# Patient Record
Sex: Male | Born: 1940
Health system: Southern US, Community
[De-identification: ages and names within clinical notes are randomized; demographics above are authoritative.]

## PROBLEM LIST (undated history)

## (undated) DIAGNOSIS — I10 Essential (primary) hypertension: Secondary | ICD-10-CM

## (undated) DIAGNOSIS — M109 Gout, unspecified: Secondary | ICD-10-CM

## (undated) DIAGNOSIS — M1990 Unspecified osteoarthritis, unspecified site: Secondary | ICD-10-CM

## (undated) DIAGNOSIS — Z9889 Other specified postprocedural states: Secondary | ICD-10-CM

## (undated) DIAGNOSIS — T8859XA Other complications of anesthesia, initial encounter: Secondary | ICD-10-CM

## (undated) DIAGNOSIS — R112 Nausea with vomiting, unspecified: Secondary | ICD-10-CM

## (undated) DIAGNOSIS — E039 Hypothyroidism, unspecified: Secondary | ICD-10-CM

## (undated) DIAGNOSIS — K219 Gastro-esophageal reflux disease without esophagitis: Secondary | ICD-10-CM

## (undated) HISTORY — PX: APPENDECTOMY: SHX54

## (undated) HISTORY — PX: JOINT REPLACEMENT: SHX530

## (undated) HISTORY — PX: TONSILLECTOMY: SUR1361

---

## 2017-04-09 DIAGNOSIS — M1000 Idiopathic gout, unspecified site: Secondary | ICD-10-CM | POA: Diagnosis not present

## 2017-04-09 DIAGNOSIS — I10 Essential (primary) hypertension: Secondary | ICD-10-CM | POA: Diagnosis not present

## 2017-04-09 DIAGNOSIS — M7750 Other enthesopathy of unspecified foot: Secondary | ICD-10-CM | POA: Diagnosis not present

## 2017-04-09 DIAGNOSIS — E7800 Pure hypercholesterolemia, unspecified: Secondary | ICD-10-CM | POA: Diagnosis not present

## 2017-04-09 DIAGNOSIS — Z23 Encounter for immunization: Secondary | ICD-10-CM | POA: Diagnosis not present

## 2017-04-09 DIAGNOSIS — Z125 Encounter for screening for malignant neoplasm of prostate: Secondary | ICD-10-CM | POA: Diagnosis not present

## 2017-10-07 DIAGNOSIS — E790 Hyperuricemia without signs of inflammatory arthritis and tophaceous disease: Secondary | ICD-10-CM | POA: Diagnosis not present

## 2017-10-07 DIAGNOSIS — I10 Essential (primary) hypertension: Secondary | ICD-10-CM | POA: Diagnosis not present

## 2017-10-07 DIAGNOSIS — E7800 Pure hypercholesterolemia, unspecified: Secondary | ICD-10-CM | POA: Diagnosis not present

## 2017-10-07 DIAGNOSIS — N401 Enlarged prostate with lower urinary tract symptoms: Secondary | ICD-10-CM | POA: Diagnosis not present

## 2017-10-07 DIAGNOSIS — R3911 Hesitancy of micturition: Secondary | ICD-10-CM | POA: Diagnosis not present

## 2018-04-12 DIAGNOSIS — N401 Enlarged prostate with lower urinary tract symptoms: Secondary | ICD-10-CM | POA: Diagnosis not present

## 2018-04-12 DIAGNOSIS — R3911 Hesitancy of micturition: Secondary | ICD-10-CM | POA: Diagnosis not present

## 2018-04-12 DIAGNOSIS — I10 Essential (primary) hypertension: Secondary | ICD-10-CM | POA: Diagnosis not present

## 2018-04-12 DIAGNOSIS — E7800 Pure hypercholesterolemia, unspecified: Secondary | ICD-10-CM | POA: Diagnosis not present

## 2018-04-12 DIAGNOSIS — L308 Other specified dermatitis: Secondary | ICD-10-CM | POA: Diagnosis not present

## 2018-04-12 DIAGNOSIS — Z0000 Encounter for general adult medical examination without abnormal findings: Secondary | ICD-10-CM | POA: Diagnosis not present

## 2018-04-12 DIAGNOSIS — E790 Hyperuricemia without signs of inflammatory arthritis and tophaceous disease: Secondary | ICD-10-CM | POA: Diagnosis not present

## 2018-10-11 DIAGNOSIS — I10 Essential (primary) hypertension: Secondary | ICD-10-CM | POA: Diagnosis not present

## 2018-10-11 DIAGNOSIS — R3911 Hesitancy of micturition: Secondary | ICD-10-CM | POA: Diagnosis not present

## 2018-10-11 DIAGNOSIS — N401 Enlarged prostate with lower urinary tract symptoms: Secondary | ICD-10-CM | POA: Diagnosis not present

## 2018-10-11 DIAGNOSIS — E7800 Pure hypercholesterolemia, unspecified: Secondary | ICD-10-CM | POA: Diagnosis not present

## 2018-10-11 DIAGNOSIS — T2122XA Burn of second degree of abdominal wall, initial encounter: Secondary | ICD-10-CM | POA: Diagnosis not present

## 2019-04-14 DIAGNOSIS — Z0000 Encounter for general adult medical examination without abnormal findings: Secondary | ICD-10-CM | POA: Diagnosis not present

## 2019-04-14 DIAGNOSIS — I10 Essential (primary) hypertension: Secondary | ICD-10-CM | POA: Diagnosis not present

## 2019-04-14 DIAGNOSIS — E7800 Pure hypercholesterolemia, unspecified: Secondary | ICD-10-CM | POA: Diagnosis not present

## 2019-04-14 DIAGNOSIS — E790 Hyperuricemia without signs of inflammatory arthritis and tophaceous disease: Secondary | ICD-10-CM | POA: Diagnosis not present

## 2019-04-14 DIAGNOSIS — N401 Enlarged prostate with lower urinary tract symptoms: Secondary | ICD-10-CM | POA: Diagnosis not present

## 2019-10-19 DIAGNOSIS — R3911 Hesitancy of micturition: Secondary | ICD-10-CM | POA: Diagnosis not present

## 2019-10-19 DIAGNOSIS — N401 Enlarged prostate with lower urinary tract symptoms: Secondary | ICD-10-CM | POA: Diagnosis not present

## 2019-10-19 DIAGNOSIS — E7800 Pure hypercholesterolemia, unspecified: Secondary | ICD-10-CM | POA: Diagnosis not present

## 2019-10-19 DIAGNOSIS — E790 Hyperuricemia without signs of inflammatory arthritis and tophaceous disease: Secondary | ICD-10-CM | POA: Diagnosis not present

## 2019-10-19 DIAGNOSIS — I10 Essential (primary) hypertension: Secondary | ICD-10-CM | POA: Diagnosis not present

## 2019-10-19 DIAGNOSIS — B49 Unspecified mycosis: Secondary | ICD-10-CM | POA: Diagnosis not present

## 2020-01-02 DIAGNOSIS — M25552 Pain in left hip: Secondary | ICD-10-CM | POA: Diagnosis not present

## 2020-01-04 DIAGNOSIS — Z01818 Encounter for other preprocedural examination: Secondary | ICD-10-CM | POA: Diagnosis not present

## 2020-01-04 DIAGNOSIS — E7800 Pure hypercholesterolemia, unspecified: Secondary | ICD-10-CM | POA: Diagnosis not present

## 2020-01-04 DIAGNOSIS — R001 Bradycardia, unspecified: Secondary | ICD-10-CM | POA: Diagnosis not present

## 2020-01-04 DIAGNOSIS — I10 Essential (primary) hypertension: Secondary | ICD-10-CM | POA: Diagnosis not present

## 2020-01-04 DIAGNOSIS — E031 Congenital hypothyroidism without goiter: Secondary | ICD-10-CM | POA: Diagnosis not present

## 2020-01-09 ENCOUNTER — Ambulatory Visit: Payer: Self-pay | Admitting: Student

## 2020-01-16 ENCOUNTER — Ambulatory Visit: Payer: Self-pay | Admitting: Student

## 2020-01-16 NOTE — H&P (View-Only) (Signed)
TOTAL HIP REVISION ADMISSION H&P  Patient is admitted for left revision total hip arthroplasty.  Subjective:  Chief Complaint: left hip pain  HPI: Jay Washington, 79 y.o. male, has a history of pain and functional disability in the left hip due to arthritis and patient has failed non-surgical conservative treatments for greater than 12 weeks to include NSAID's and/or analgesics and activity modification. The indications for the revision total hip arthroplasty are complete poly-wear.  Onset of symptoms was gradual starting 2 years ago with gradually worsening course since that time.  Prior procedures on the left hip include arthroplasty.  Patient currently rates pain in the left hip at 8 out of 10 with activity.  There is worsening of pain with activity and weight bearing, pain that interfers with activities of daily living and pain with passive range of motion. Patient has evidence of polywear by imaging studies.  This condition presents safety issues increasing the risk of falls.  There is no current active infection.  There are no problems to display for this patient.  Past Medical History:  Diagnosis Date  . Arthritis    hands,   . Complication of anesthesia   . GERD (gastroesophageal reflux disease)   . Gout   . Hypertension   . Hypothyroidism   . PONV (postoperative nausea and vomiting)     Past Surgical History:  Procedure Laterality Date  . APPENDECTOMY    . JOINT REPLACEMENT Left    hip  . TONSILLECTOMY      No current facility-administered medications for this visit.   No current outpatient medications on file.   Facility-Administered Medications Ordered in Other Visits  Medication Dose Route Frequency Provider Last Rate Last Admin  . 0.9 %  sodium chloride infusion   Intravenous Continuous Cherlynn June B, PA      . acetaminophen (OFIRMEV) IV 1,000 mg  1,000 mg Intravenous Once Daiana Vitiello B, [place]      . acetaminophen (TYLENOL) tablet 1,000 mg  1,000 mg Oral Once  Annye Asa, MD      . ceFAZolin (ANCEF) IVPB 2g/100 mL premix  2 g Intravenous On Call to OR Cherlynn June B, PA      . lactated ringers infusion   Intravenous Continuous Janeece Riggers, MD 10 mL/hr at 01/26/20 0910 New Bag at 01/26/20 0910  . povidone-iodine 10 % swab 2 application  2 application Topical Once Cherlynn June B, [place]      . tranexamic acid (CYKLOKAPRON) IVPB 1,000 mg  1,000 mg Intravenous To OR Cherlynn June B, PA       No Known Allergies  Social History   Tobacco Use  . Smoking status: Never Smoker  . Smokeless tobacco: Never Used  Substance Use Topics  . Alcohol use: Never    No family history on file.    Review of Systems  Musculoskeletal: Positive for arthralgias.  All other systems reviewed and are negative.   Objective:  Physical Exam HENT:     Head: Normocephalic.  Eyes:     Pupils: Pupils are equal, round, and reactive to light.  Cardiovascular:     Rate and Rhythm: Normal rate and regular rhythm.     Heart sounds: Normal heart sounds.  Pulmonary:     Breath sounds: Normal breath sounds.  Abdominal:     Palpations: Abdomen is soft.     Tenderness: There is no abdominal tenderness.  Genitourinary:    Comments: Deferred Musculoskeletal:     Cervical back: Normal  range of motion.     Comments: Examination left hips reveals no wounds, lesions, or bruising. There are well healed scars on the posterior and lateral portions of the left hip. He does not have trochanteric tenderness to palpation of the left hip. moderate pain with passive range of motion. Pain in the position of impingement.He is neurovascularly intact.  Skin:    General: Skin is warm and dry.  Neurological:     Mental Status: He is alert and oriented to person, place, and time.  Psychiatric:        Mood and Affect: Mood normal.     Vital signs in last 24 hours: @VSRANGES @   Labs:   Estimated body mass index is 31.01 kg/m as calculated from the following:   Height  as of 01/26/20: 5\' 9"  (1.753 m).   Weight as of 01/26/20: 95.3 kg.  Imaging Review:  Plain radiographs demonstrate severe degenerative joint disease of the left hip(s). The bone quality appears to be adequate for age and reported activity level. There is excessive polywear.    Assessment/Plan:  End stage arthritis, left hip(s) with failed previous arthroplasty.  The patient history, physical examination, clinical judgement of the provider and imaging studies are consistent with end stage degenerative joint disease of the left hip(s), previous total hip arthroplasty. Revision total hip arthroplasty is deemed medically necessary. The treatment options including medical management, injection therapy, arthroscopy and arthroplasty were discussed at length. The risks and benefits of total hip arthroplasty were presented and reviewed. The risks due to aseptic loosening, infection, stiffness, dislocation/subluxation,  thromboembolic complications and other imponderables were discussed.  The patient acknowledged the explanation, agreed to proceed with the plan and consent was signed. Patient is being admitted for inpatient treatment for surgery, pain control, PT, OT, prophylactic antibiotics, VTE prophylaxis, progressive ambulation and ADL's and discharge planning. The patient is planning to be discharged home.

## 2020-01-16 NOTE — H&P (Signed)
TOTAL HIP REVISION ADMISSION H&P  Patient is admitted for left revision total hip arthroplasty.  Subjective:  Chief Complaint: left hip pain  HPI: Jay Washington, 79 y.o. male, has a history of pain and functional disability in the left hip due to arthritis and patient has failed non-surgical conservative treatments for greater than 12 weeks to include NSAID's and/or analgesics and activity modification. The indications for the revision total hip arthroplasty are complete poly-wear.  Onset of symptoms was gradual starting 2 years ago with gradually worsening course since that time.  Prior procedures on the left hip include arthroplasty.  Patient currently rates pain in the left hip at 8 out of 10 with activity.  There is worsening of pain with activity and weight bearing, pain that interfers with activities of daily living and pain with passive range of motion. Patient has evidence of polywear by imaging studies.  This condition presents safety issues increasing the risk of falls.  There is no current active infection.  There are no problems to display for this patient.  Past Medical History:  Diagnosis Date  . Arthritis    hands,   . Complication of anesthesia   . GERD (gastroesophageal reflux disease)   . Gout   . Hypertension   . Hypothyroidism   . PONV (postoperative nausea and vomiting)     Past Surgical History:  Procedure Laterality Date  . APPENDECTOMY    . JOINT REPLACEMENT Left    hip  . TONSILLECTOMY      No current facility-administered medications for this visit.   No current outpatient medications on file.   Facility-Administered Medications Ordered in Other Visits  Medication Dose Route Frequency Provider Last Rate Last Admin  . 0.9 %  sodium chloride infusion   Intravenous Continuous Cherlynn June B, PA      . acetaminophen (OFIRMEV) IV 1,000 mg  1,000 mg Intravenous Once Luay Balding B, [place]      . acetaminophen (TYLENOL) tablet 1,000 mg  1,000 mg Oral Once  Annye Asa, MD      . ceFAZolin (ANCEF) IVPB 2g/100 mL premix  2 g Intravenous On Call to OR Cherlynn June B, PA      . lactated ringers infusion   Intravenous Continuous Janeece Riggers, MD 10 mL/hr at 01/26/20 0910 New Bag at 01/26/20 0910  . povidone-iodine 10 % swab 2 application  2 application Topical Once Cherlynn June B, [place]      . tranexamic acid (CYKLOKAPRON) IVPB 1,000 mg  1,000 mg Intravenous To OR Cherlynn June B, PA       No Known Allergies  Social History   Tobacco Use  . Smoking status: Never Smoker  . Smokeless tobacco: Never Used  Substance Use Topics  . Alcohol use: Never    No family history on file.    Review of Systems  Musculoskeletal: Positive for arthralgias.  All other systems reviewed and are negative.   Objective:  Physical Exam HENT:     Head: Normocephalic.  Eyes:     Pupils: Pupils are equal, round, and reactive to light.  Cardiovascular:     Rate and Rhythm: Normal rate and regular rhythm.     Heart sounds: Normal heart sounds.  Pulmonary:     Breath sounds: Normal breath sounds.  Abdominal:     Palpations: Abdomen is soft.     Tenderness: There is no abdominal tenderness.  Genitourinary:    Comments: Deferred Musculoskeletal:     Cervical back: Normal  range of motion.     Comments: Examination left hips reveals no wounds, lesions, or bruising. There are well healed scars on the posterior and lateral portions of the left hip. He does not have trochanteric tenderness to palpation of the left hip. moderate pain with passive range of motion. Pain in the position of impingement.He is neurovascularly intact.  Skin:    General: Skin is warm and dry.  Neurological:     Mental Status: He is alert and oriented to person, place, and time.  Psychiatric:        Mood and Affect: Mood normal.     Vital signs in last 24 hours: @VSRANGES @   Labs:   Estimated body mass index is 31.01 kg/m as calculated from the following:   Height  as of 01/26/20: 5\' 9"  (1.753 m).   Weight as of 01/26/20: 95.3 kg.  Imaging Review:  Plain radiographs demonstrate severe degenerative joint disease of the left hip(s). The bone quality appears to be adequate for age and reported activity level. There is excessive polywear.    Assessment/Plan:  End stage arthritis, left hip(s) with failed previous arthroplasty.  The patient history, physical examination, clinical judgement of the provider and imaging studies are consistent with end stage degenerative joint disease of the left hip(s), previous total hip arthroplasty. Revision total hip arthroplasty is deemed medically necessary. The treatment options including medical management, injection therapy, arthroscopy and arthroplasty were discussed at length. The risks and benefits of total hip arthroplasty were presented and reviewed. The risks due to aseptic loosening, infection, stiffness, dislocation/subluxation,  thromboembolic complications and other imponderables were discussed.  The patient acknowledged the explanation, agreed to proceed with the plan and consent was signed. Patient is being admitted for inpatient treatment for surgery, pain control, PT, OT, prophylactic antibiotics, VTE prophylaxis, progressive ambulation and ADL's and discharge planning. The patient is planning to be discharged home.

## 2020-01-18 ENCOUNTER — Encounter (HOSPITAL_COMMUNITY): Payer: Self-pay

## 2020-01-18 NOTE — Patient Instructions (Addendum)
DUE TO COVID-19 ONLY ONE VISITOR IS ALLOWED TO COME WITH YOU AND STAY IN THE WAITING ROOM ONLY DURING PRE OP AND PROCEDURE DAY OF SURGERY. THE 1 VISITOR  MAY VISIT WITH YOU AFTER SURGERY IN YOUR PRIVATE ROOM DURING VISITING HOURS ONLY!  YOU NEED TO HAVE A COVID 19 TEST ON_12/6______ @_1 :30______, THIS TEST MUST BE DONE BEFORE SURGERY,  COVID TESTING SITE [place] [id number], IT IS ON THE RIGHT GOING OUT [address] APPROXIMATELY  2 MINUTES PAST [employer] ON THE RIGHT. ONCE YOUR COVID TEST IS COMPLETED,  PLEASE BEGIN THE QUARANTINE INSTRUCTIONS AS OUTLINED IN YOUR HANDOUT.                Eula Flax   Your procedure is scheduled on: 01/26/20   Report to [facility] Main  Entrance   Report to admitting at  8:35 AM     Call this number if you have problems the morning of surgery [phone]    Midnight. BRUSH YOUR TEETH MORNING OF SURGERY AND RINSE YOUR MOUTH OUT, NO CHEWING GUM CANDY OR MINTS.   No food after midnight.    You may have clear liquid until 8:00 AM.    At 7:30 AM drink pre surgery drink  . Nothing by mouth after 8:00 AM.   Take these medicines the morning of surgery with A SIP OF WATER: Amlodipine, Allopurinol, Finesteride, Levothyroxine                                 You may not have any metal on your body including               piercings  Do not wear jewelry,  lotions, powders or deodorant                       Men may shave face and neck.   Do not bring valuables to the hospital. [place].  Contacts, dentures or bridgework may not be worn into surgery.      Patients discharged the day of surgery will not be allowed to drive home.   IF YOU ARE HAVING SURGERY AND GOING HOME THE SAME DAY, YOU MUST HAVE AN ADULT TO DRIVE YOU HOME AND BE WITH YOU FOR 24 HOURS.   YOU MAY GO HOME BY TAXI OR UBER OR ORTHERWISE, BUT AN ADULT MUST ACCOMPANY YOU HOME AND STAY WITH  YOU FOR 24 HOURS.  Name and phone number of your driver:  Special Instructions: N/A              Please read over the following fact sheets you were given: _____________________________________________________________________             [facility] - Preparing for Surgery Before surgery, you can play an important role.   Because skin is not sterile, your skin needs to be as free of germs as possible.   You can reduce the number of germs on your skin by washing with CHG (chlorahexidine gluconate) soap before surgery.   CHG is an antiseptic cleaner which kills germs and bonds with the skin to continue killing germs even after washing. Please DO NOT use if you have an allergy to CHG or antibacterial soaps.   If your skin becomes reddened/irritated  stop using the CHG and inform your nurse when you arrive at Short Stay. .  You may shave your face/neck.  Please follow these instructions carefully:  1.  Shower with CHG Soap the night before surgery and the  morning of Surgery.  2.  If you choose to wash your hair, wash your hair first as usual with your  normal  shampoo.  3.  After you shampoo, rinse your hair and body thoroughly to remove the  shampoo.                                        4.  Use CHG as you would any other liquid soap.  You can apply chg directly  to the skin and wash                       Gently with a scrungie or clean washcloth.  5.  Apply the CHG Soap to your body ONLY FROM THE NECK DOWN.   Do not use on face/ open                           Wound or open sores. Avoid contact with eyes, ears mouth and genitals (private parts).                       Wash face,  Genitals (private parts) with your normal soap.             6.  Wash thoroughly, paying special attention to the area where your surgery  will be performed.  7.  Thoroughly rinse your body with warm water from the neck down.  8.  DO NOT shower/wash with your normal soap after using and rinsing off  the CHG Soap.              9.  Pat yourself dry with a clean towel.            10.  Wear clean pajamas.            11.  Place clean sheets on your bed the night of your first shower and do not  sleep with pets. Day of Surgery : Do not apply any lotions/deodorants the morning of surgery.  Please wear clean clothes to the hospital/surgery center.  FAILURE TO FOLLOW THESE INSTRUCTIONS MAY RESULT IN THE CANCELLATION OF YOUR SURGERY PATIENT SIGNATURE_________________________________  NURSE SIGNATURE__________________________________  ________________________________________________________________________   Adam Phenix  An incentive spirometer is a tool that can help keep your lungs clear and active. This tool measures how well you are filling your lungs with each breath. Taking long deep breaths may help reverse or decrease the chance of developing breathing (pulmonary) problems (especially infection) following:  A long period of time when you are unable to move or be active. BEFORE THE PROCEDURE   If the spirometer includes an indicator to show your best effort, your nurse or respiratory therapist will set it to a desired goal.  If possible, sit up straight or lean slightly forward. Try not to slouch.  Hold the incentive spirometer in an upright position. INSTRUCTIONS FOR USE  1. Sit on the edge of your bed if possible, or sit up as far as you can in bed or on a chair. 2. Hold the incentive spirometer in an upright position. 3.  Breathe out normally. 4. Place the mouthpiece in your mouth and seal your lips tightly around it. 5. Breathe in slowly and as deeply as possible, raising the piston or the ball toward the top of the column. 6. Hold your breath for 3-5 seconds or for as long as possible. Allow the piston or ball to fall to the bottom of the column. 7. Remove the mouthpiece from your mouth and breathe out normally. 8. Rest for a few seconds and repeat Steps 1 through 7 at least 10 times  every 1-2 hours when you are awake. Take your time and take a few normal breaths between deep breaths. 9. The spirometer may include an indicator to show your best effort. Use the indicator as a goal to work toward during each repetition. 10. After each set of 10 deep breaths, practice coughing to be sure your lungs are clear. If you have an incision (the cut made at the time of surgery), support your incision when coughing by placing a pillow or rolled up towels firmly against it. Once you are able to get out of bed, walk around indoors and cough well. You may stop using the incentive spirometer when instructed by your caregiver.  RISKS AND COMPLICATIONS  Take your time so you do not get dizzy or light-headed.  If you are in pain, you may need to take or ask for pain medication before doing incentive spirometry. It is harder to take a deep breath if you are having pain. AFTER USE  Rest and breathe slowly and easily.  It can be helpful to keep track of a log of your progress. Your caregiver can provide you with a simple table to help with this. If you are using the spirometer at home, follow these instructions: [place] IF:   You are having difficultly using the spirometer.  You have trouble using the spirometer as often as instructed.  Your pain medication is not giving enough relief while using the spirometer.  You develop fever of 100.5 F (38.1 C) or higher. SEEK IMMEDIATE MEDICAL CARE IF:   You cough up bloody sputum that had not been present before.  You develop fever of 102 F (38.9 C) or greater.  You develop worsening pain at or near the incision site. MAKE SURE YOU:   Understand these instructions.  Will watch your condition.  Will get help right away if you are not doing well or get worse. Document Released: 06/16/2006 Document Revised: 04/28/2011 Document Reviewed: 08/17/2006 [facility] Patient Information 2014 [place],  [place].   ________________________________________________________________________

## 2020-01-20 ENCOUNTER — Encounter (HOSPITAL_COMMUNITY)
Admission: RE | Admit: 2020-01-20 | Discharge: 2020-01-20 | Disposition: A | Discharge status: Home / Self Care | Payer: PPO | Source: Ambulatory Visit | Attending: Orthopedic Surgery | Admitting: Orthopedic Surgery

## 2020-01-20 ENCOUNTER — Encounter (HOSPITAL_COMMUNITY): Payer: Self-pay

## 2020-01-20 ENCOUNTER — Other Ambulatory Visit: Payer: Self-pay

## 2020-01-20 DIAGNOSIS — Z01812 Encounter for preprocedural laboratory examination: Secondary | ICD-10-CM | POA: Diagnosis not present

## 2020-01-20 HISTORY — DX: Nausea with vomiting, unspecified: R11.2

## 2020-01-20 HISTORY — DX: Hypothyroidism, unspecified: E03.9

## 2020-01-20 HISTORY — DX: Gastro-esophageal reflux disease without esophagitis: K21.9

## 2020-01-20 HISTORY — DX: Gout, unspecified: M10.9

## 2020-01-20 HISTORY — DX: Other specified postprocedural states: Z98.890

## 2020-01-20 HISTORY — DX: Other complications of anesthesia, initial encounter: T88.59XA

## 2020-01-20 HISTORY — DX: Essential (primary) hypertension: I10

## 2020-01-20 HISTORY — DX: Unspecified osteoarthritis, unspecified site: M19.90

## 2020-01-20 LAB — URINALYSIS, ROUTINE W REFLEX MICROSCOPIC
Bacteria, UA: NONE SEEN
Bilirubin Urine: NEGATIVE
Glucose, UA: NEGATIVE mg/dL
Ketones, ur: NEGATIVE mg/dL
Leukocytes,Ua: NEGATIVE
Nitrite: NEGATIVE
Protein, ur: NEGATIVE mg/dL
Specific Gravity, Urine: 1.015 (ref 1.005–1.030)
pH: 5 (ref 5.0–8.0)

## 2020-01-20 LAB — SURGICAL PCR SCREEN
MRSA, PCR: NEGATIVE
Staphylococcus aureus: NEGATIVE

## 2020-01-20 LAB — COMPREHENSIVE METABOLIC PANEL
ALT: 27 U/L (ref 0–44)
AST: 22 U/L (ref 15–41)
Albumin: 4.6 g/dL (ref 3.5–5.0)
Alkaline Phosphatase: 84 U/L (ref 38–126)
Anion gap: 10 (ref 5–15)
BUN: 26 mg/dL — ABNORMAL HIGH (ref 8–23)
CO2: 27 mmol/L (ref 22–32)
Calcium: 10 mg/dL (ref 8.9–10.3)
Chloride: 105 mmol/L (ref 98–111)
Creatinine, Ser: 1.43 mg/dL — ABNORMAL HIGH (ref 0.61–1.24)
GFR, Estimated: 50 mL/min — ABNORMAL LOW (ref >60)
Glucose, Bld: 114 mg/dL — ABNORMAL HIGH (ref 70–99)
Potassium: 5 mmol/L (ref 3.5–5.1)
Sodium: 142 mmol/L (ref 135–145)
Total Bilirubin: 0.6 mg/dL (ref 0.3–1.2)
Total Protein: 7.6 g/dL (ref 6.5–8.1)

## 2020-01-20 LAB — CBC
HCT: 48.1 % (ref 39.0–52.0)
Hemoglobin: 15.8 g/dL (ref 13.0–17.0)
MCH: 31.2 pg (ref 26.0–34.0)
MCHC: 32.8 g/dL (ref 30.0–36.0)
MCV: 95.1 fL (ref 80.0–100.0)
Platelets: 198 10*3/uL (ref 150–400)
RBC: 5.06 MIL/uL (ref 4.22–5.81)
RDW: 14 % (ref 11.5–15.5)
WBC: 5.3 10*3/uL (ref 4.0–10.5)
nRBC: 0 % (ref 0.0–0.2)

## 2020-01-20 LAB — PROTIME-INR
INR: 1.1 (ref 0.8–1.2)
Prothrombin Time: 13.4 seconds (ref 11.4–15.2)

## 2020-01-20 NOTE — Progress Notes (Signed)
COVID Vaccine Completed:Yes Date COVID Vaccine completed:05/12/19 COVID vaccine manufacturer: Pfizer      PCP - Dr. Ilene Qua Cardiologist - none  Chest x-ray - no EKG - 01/04/20-requested from [place] Stress Test - no ECHO - no Cardiac Cath - no Pacemaker/ICD device last checked:NA  Sleep Study -no  CPAP -   Fasting Blood Sugar - NA Checks Blood Sugar _____ times a day  Blood Thinner Instructions:[facility]/ Dr. Ilene Qua Aspirin Instructions:stop 5 days prior to DOD/ Dr. Lyla Glassing Last Dose:01/20/20  Anesthesia review:   Patient denies shortness of breath, fever, cough and chest pain at PAT appointment yes   Patient verbalized understanding of instructions that were given to them at the PAT appointment. Patient was also instructed that they will need to review over the PAT instructions again at home before surgery. Yes Pt reports that he has no SOB climbing stair, doing work or with ADLs

## 2020-01-23 ENCOUNTER — Other Ambulatory Visit (HOSPITAL_COMMUNITY)
Admission: RE | Admit: 2020-01-23 | Discharge: 2020-01-23 | Disposition: A | Discharge status: Home / Self Care | Payer: PPO | Source: Ambulatory Visit | Attending: Orthopedic Surgery | Admitting: Orthopedic Surgery

## 2020-01-23 DIAGNOSIS — Z20822 Contact with and (suspected) exposure to covid-19: Secondary | ICD-10-CM | POA: Diagnosis not present

## 2020-01-23 DIAGNOSIS — Z01812 Encounter for preprocedural laboratory examination: Secondary | ICD-10-CM | POA: Insufficient documentation

## 2020-01-23 LAB — SARS CORONAVIRUS 2 (TAT 6-24 HRS): SARS Coronavirus 2: NEGATIVE

## 2020-01-26 ENCOUNTER — Other Ambulatory Visit: Payer: Self-pay

## 2020-01-26 ENCOUNTER — Encounter (HOSPITAL_COMMUNITY): Payer: Self-pay | Admitting: Orthopedic Surgery

## 2020-01-26 ENCOUNTER — Ambulatory Visit (HOSPITAL_COMMUNITY): Payer: PPO

## 2020-01-26 ENCOUNTER — Ambulatory Visit (HOSPITAL_COMMUNITY)
Admission: RE | Admit: 2020-01-26 | Discharge: 2020-01-27 | Disposition: A | Discharge status: Home / Self Care | Payer: PPO | Attending: Orthopedic Surgery | Admitting: Orthopedic Surgery

## 2020-01-26 ENCOUNTER — Ambulatory Visit (HOSPITAL_COMMUNITY): Payer: PPO | Admitting: Certified Registered Nurse Anesthetist

## 2020-01-26 ENCOUNTER — Encounter (HOSPITAL_COMMUNITY)
Admission: RE | Disposition: A | Discharge status: Home / Self Care | Payer: Self-pay | Source: Home / Self Care | Attending: Orthopedic Surgery

## 2020-01-26 DIAGNOSIS — Z09 Encounter for follow-up examination after completed treatment for conditions other than malignant neoplasm: Secondary | ICD-10-CM

## 2020-01-26 DIAGNOSIS — Z7982 Long term (current) use of aspirin: Secondary | ICD-10-CM | POA: Diagnosis not present

## 2020-01-26 DIAGNOSIS — M1612 Unilateral primary osteoarthritis, left hip: Secondary | ICD-10-CM | POA: Insufficient documentation

## 2020-01-26 DIAGNOSIS — Y831 Surgical operation with implant of artificial internal device as the cause of abnormal reaction of the patient, or of later complication, without mention of misadventure at the time of the procedure: Secondary | ICD-10-CM | POA: Insufficient documentation

## 2020-01-26 DIAGNOSIS — Z419 Encounter for procedure for purposes other than remedying health state, unspecified: Secondary | ICD-10-CM

## 2020-01-26 DIAGNOSIS — Z79899 Other long term (current) drug therapy: Secondary | ICD-10-CM | POA: Insufficient documentation

## 2020-01-26 DIAGNOSIS — T84061A Wear of articular bearing surface of internal prosthetic left hip joint, initial encounter: Secondary | ICD-10-CM | POA: Diagnosis not present

## 2020-01-26 DIAGNOSIS — K219 Gastro-esophageal reflux disease without esophagitis: Secondary | ICD-10-CM | POA: Diagnosis not present

## 2020-01-26 DIAGNOSIS — Z471 Aftercare following joint replacement surgery: Secondary | ICD-10-CM | POA: Diagnosis not present

## 2020-01-26 DIAGNOSIS — Z96642 Presence of left artificial hip joint: Secondary | ICD-10-CM | POA: Diagnosis not present

## 2020-01-26 DIAGNOSIS — M109 Gout, unspecified: Secondary | ICD-10-CM | POA: Diagnosis not present

## 2020-01-26 DIAGNOSIS — T84018A Broken internal joint prosthesis, other site, initial encounter: Secondary | ICD-10-CM

## 2020-01-26 DIAGNOSIS — E039 Hypothyroidism, unspecified: Secondary | ICD-10-CM | POA: Diagnosis not present

## 2020-01-26 DIAGNOSIS — T84091A Other mechanical complication of internal left hip prosthesis, initial encounter: Secondary | ICD-10-CM | POA: Diagnosis not present

## 2020-01-26 HISTORY — PX: TOTAL HIP REVISION: SHX763

## 2020-01-26 LAB — TYPE AND SCREEN
ABO/RH(D): O POS
Antibody Screen: NEGATIVE

## 2020-01-26 LAB — ABO/RH: ABO/RH(D): O POS

## 2020-01-26 SURGERY — TOTAL HIP REVISION
Anesthesia: Spinal | Site: Hip | Laterality: Left

## 2020-01-26 MED ORDER — POVIDONE-IODINE 10 % EX SWAB
2.0000 "application " | Freq: Once | CUTANEOUS | Status: AC
  Administered 2020-01-26: 2 via TOPICAL

## 2020-01-26 MED ORDER — CHLORHEXIDINE GLUCONATE 0.12 % MT SOLN
15.0000 mL | Freq: Once | OROMUCOSAL | Status: AC
  Administered 2020-01-26: 15 mL via OROMUCOSAL

## 2020-01-26 MED ORDER — VANCOMYCIN HCL 1000 MG IV SOLR
INTRAVENOUS | Status: DC | PRN
  Administered 2020-01-26: 1000 mg via INTRAVENOUS

## 2020-01-26 MED ORDER — SODIUM CHLORIDE 0.9 % IV SOLN: INTRAVENOUS | Status: DC

## 2020-01-26 MED ORDER — FENTANYL CITRATE (PF) 100 MCG/2ML IJ SOLN
INTRAMUSCULAR | Status: DC | PRN
Start: 2020-01-26 — End: 2020-01-26
  Administered 2020-01-26 (×2): 50 ug via INTRAVENOUS

## 2020-01-26 MED ORDER — PHENYLEPHRINE 40 MCG/ML (10ML) SYRINGE FOR IV PUSH (FOR BLOOD PRESSURE SUPPORT)
PREFILLED_SYRINGE | INTRAVENOUS | Status: AC
  Filled 2020-01-26: qty 10

## 2020-01-26 MED ORDER — SODIUM CHLORIDE (PF) 0.9 % IJ SOLN
INTRAMUSCULAR | Status: AC
  Filled 2020-01-26: qty 50

## 2020-01-26 MED ORDER — ALUM & MAG HYDROXIDE-SIMETH 200-200-20 MG/5ML PO SUSP: 30.0000 mL | ORAL | Status: DC | PRN

## 2020-01-26 MED ORDER — PRAVASTATIN SODIUM 20 MG PO TABS
40.0000 mg | ORAL_TABLET | Freq: Every day | ORAL | Status: DC
  Administered 2020-01-26 – 2020-01-27 (×2): 40 mg via ORAL
  Filled 2020-01-26 (×2): qty 2

## 2020-01-26 MED ORDER — 0.9 % SODIUM CHLORIDE (POUR BTL) OPTIME
TOPICAL | Status: DC | PRN
  Administered 2020-01-26: 1000 mL

## 2020-01-26 MED ORDER — PROPOFOL 10 MG/ML IV BOLUS
INTRAVENOUS | Status: DC | PRN
  Administered 2020-01-26: 20 mg via INTRAVENOUS
  Administered 2020-01-26: 40 mg via INTRAVENOUS
  Administered 2020-01-26: 20 mg via INTRAVENOUS

## 2020-01-26 MED ORDER — VANCOMYCIN HCL IN DEXTROSE 1-5 GM/200ML-% IV SOLN
INTRAVENOUS | Status: AC
  Filled 2020-01-26: qty 200

## 2020-01-26 MED ORDER — METHOCARBAMOL 500 MG PO TABS: 500.0000 mg | ORAL_TABLET | Freq: Four times a day (QID) | ORAL | Status: DC | PRN

## 2020-01-26 MED ORDER — MEPERIDINE HCL 50 MG/ML IJ SOLN: 6.2500 mg | INTRAMUSCULAR | Status: DC | PRN

## 2020-01-26 MED ORDER — SODIUM CHLORIDE (PF) 0.9 % IJ SOLN
INTRAMUSCULAR | Status: DC | PRN
  Administered 2020-01-26: 30 mL

## 2020-01-26 MED ORDER — ACETAMINOPHEN 325 MG PO TABS: 325.0000 mg | ORAL_TABLET | Freq: Four times a day (QID) | ORAL | Status: DC | PRN

## 2020-01-26 MED ORDER — MIDAZOLAM HCL 2 MG/2ML IJ SOLN: 0.5000 mg | Freq: Once | INTRAMUSCULAR | Status: DC | PRN

## 2020-01-26 MED ORDER — ORAL CARE MOUTH RINSE: 15.0000 mL | Freq: Once | OROMUCOSAL | Status: AC

## 2020-01-26 MED ORDER — METHOCARBAMOL 500 MG IVPB - SIMPLE MED
500.0000 mg | Freq: Four times a day (QID) | INTRAVENOUS | Status: DC | PRN
  Filled 2020-01-26: qty 50

## 2020-01-26 MED ORDER — HYDROMORPHONE HCL 1 MG/ML IJ SOLN: 0.2500 mg | INTRAMUSCULAR | Status: DC | PRN

## 2020-01-26 MED ORDER — DOCUSATE SODIUM 100 MG PO CAPS
100.0000 mg | ORAL_CAPSULE | Freq: Two times a day (BID) | ORAL | Status: DC
  Administered 2020-01-26 – 2020-01-27 (×2): 100 mg via ORAL
  Filled 2020-01-26 (×2): qty 1

## 2020-01-26 MED ORDER — EPHEDRINE SULFATE-NACL 50-0.9 MG/10ML-% IV SOSY
PREFILLED_SYRINGE | INTRAVENOUS | Status: DC | PRN
  Administered 2020-01-26: 10 mg via INTRAVENOUS
  Administered 2020-01-26: 15 mg via INTRAVENOUS

## 2020-01-26 MED ORDER — AMLODIPINE BESYLATE 5 MG PO TABS
5.0000 mg | ORAL_TABLET | Freq: Every day | ORAL | Status: DC
  Administered 2020-01-26 – 2020-01-27 (×2): 5 mg via ORAL
  Filled 2020-01-26 (×2): qty 1

## 2020-01-26 MED ORDER — DIPHENHYDRAMINE HCL 12.5 MG/5ML PO ELIX
12.5000 mg | ORAL_SOLUTION | ORAL | Status: DC | PRN
  Administered 2020-01-27: 25 mg via ORAL
  Filled 2020-01-26: qty 10

## 2020-01-26 MED ORDER — ONDANSETRON HCL 4 MG/2ML IJ SOLN
INTRAMUSCULAR | Status: AC
  Filled 2020-01-26: qty 2

## 2020-01-26 MED ORDER — KETOROLAC TROMETHAMINE 30 MG/ML IJ SOLN
INTRAMUSCULAR | Status: AC
  Filled 2020-01-26: qty 1

## 2020-01-26 MED ORDER — LIDOCAINE HCL (PF) 2 % IJ SOLN
INTRAMUSCULAR | Status: AC
  Filled 2020-01-26: qty 5

## 2020-01-26 MED ORDER — SENNA 8.6 MG PO TABS
1.0000 | ORAL_TABLET | Freq: Two times a day (BID) | ORAL | Status: DC
  Administered 2020-01-26 – 2020-01-27 (×2): 8.6 mg via ORAL
  Filled 2020-01-26 (×2): qty 1

## 2020-01-26 MED ORDER — ASPIRIN 81 MG PO CHEW
81.0000 mg | CHEWABLE_TABLET | Freq: Two times a day (BID) | ORAL | Status: DC
  Administered 2020-01-26 – 2020-01-27 (×2): 81 mg via ORAL
  Filled 2020-01-26 (×2): qty 1

## 2020-01-26 MED ORDER — FINASTERIDE 5 MG PO TABS
5.0000 mg | ORAL_TABLET | Freq: Every day | ORAL | Status: DC
  Administered 2020-01-26 – 2020-01-27 (×2): 5 mg via ORAL
  Filled 2020-01-26 (×2): qty 1

## 2020-01-26 MED ORDER — BUPIVACAINE HCL (PF) 0.25 % IJ SOLN
INTRAMUSCULAR | Status: DC | PRN
  Administered 2020-01-26: 30 mL

## 2020-01-26 MED ORDER — OXYCODONE HCL 5 MG/5ML PO SOLN: 5.0000 mg | Freq: Once | ORAL | Status: DC | PRN

## 2020-01-26 MED ORDER — VITAMIN D 25 MCG (1000 UNIT) PO TABS
1000.0000 [IU] | ORAL_TABLET | Freq: Every day | ORAL | Status: DC
  Administered 2020-01-27: 1000 [IU] via ORAL
  Filled 2020-01-26: qty 1

## 2020-01-26 MED ORDER — SODIUM CHLORIDE 0.9 % IR SOLN
Status: DC | PRN
  Administered 2020-01-26: 3000 mL

## 2020-01-26 MED ORDER — ONDANSETRON HCL 4 MG/2ML IJ SOLN: 4.0000 mg | Freq: Four times a day (QID) | INTRAMUSCULAR | Status: DC | PRN

## 2020-01-26 MED ORDER — PROPOFOL 10 MG/ML IV BOLUS
INTRAVENOUS | Status: AC
  Filled 2020-01-26: qty 20

## 2020-01-26 MED ORDER — STERILE WATER FOR IRRIGATION IR SOLN
Status: DC | PRN
  Administered 2020-01-26: 2000 mL

## 2020-01-26 MED ORDER — LEVOTHYROXINE SODIUM 25 MCG PO TABS
25.0000 ug | ORAL_TABLET | Freq: Every day | ORAL | Status: DC
  Administered 2020-01-27: 25 ug via ORAL
  Filled 2020-01-26: qty 1

## 2020-01-26 MED ORDER — DEXAMETHASONE SODIUM PHOSPHATE 10 MG/ML IJ SOLN
INTRAMUSCULAR | Status: DC | PRN
  Administered 2020-01-26: 4 mg via INTRAVENOUS

## 2020-01-26 MED ORDER — PHENYLEPHRINE HCL-NACL 10-0.9 MG/250ML-% IV SOLN
INTRAVENOUS | Status: DC | PRN
  Administered 2020-01-26: 50 ug/min via INTRAVENOUS

## 2020-01-26 MED ORDER — POVIDONE-IODINE 10 % EX SWAB: 2.0000 "application " | Freq: Once | CUTANEOUS | Status: DC

## 2020-01-26 MED ORDER — HYDROCODONE-ACETAMINOPHEN 5-325 MG PO TABS
1.0000 | ORAL_TABLET | ORAL | Status: DC | PRN
  Filled 2020-01-26: qty 1

## 2020-01-26 MED ORDER — ACETAMINOPHEN 500 MG PO TABS: 1000.0000 mg | ORAL_TABLET | Freq: Once | ORAL | Status: DC

## 2020-01-26 MED ORDER — PROMETHAZINE HCL 25 MG/ML IJ SOLN: 6.2500 mg | INTRAMUSCULAR | Status: DC | PRN

## 2020-01-26 MED ORDER — ONDANSETRON HCL 4 MG PO TABS: 4.0000 mg | ORAL_TABLET | Freq: Four times a day (QID) | ORAL | Status: DC | PRN

## 2020-01-26 MED ORDER — FENTANYL CITRATE (PF) 100 MCG/2ML IJ SOLN
INTRAMUSCULAR | Status: AC
  Filled 2020-01-26: qty 2

## 2020-01-26 MED ORDER — CEFAZOLIN SODIUM-DEXTROSE 2-4 GM/100ML-% IV SOLN
2.0000 g | Freq: Four times a day (QID) | INTRAVENOUS | Status: AC
  Administered 2020-01-26 (×2): 2 g via INTRAVENOUS
  Filled 2020-01-26 (×2): qty 100

## 2020-01-26 MED ORDER — LACTATED RINGERS IV SOLN: INTRAVENOUS | Status: DC

## 2020-01-26 MED ORDER — MORPHINE SULFATE (PF) 2 MG/ML IV SOLN: 0.5000 mg | INTRAVENOUS | Status: DC | PRN

## 2020-01-26 MED ORDER — METOCLOPRAMIDE HCL 5 MG/ML IJ SOLN: 5.0000 mg | Freq: Three times a day (TID) | INTRAMUSCULAR | Status: DC | PRN

## 2020-01-26 MED ORDER — IRRISEPT - 450ML BOTTLE WITH 0.05% CHG IN STERILE WATER, USP 99.95% OPTIME
TOPICAL | Status: DC | PRN
  Administered 2020-01-26: 450 mL

## 2020-01-26 MED ORDER — PHENOL 1.4 % MT LIQD: 1.0000 | OROMUCOSAL | Status: DC | PRN

## 2020-01-26 MED ORDER — BUPIVACAINE IN DEXTROSE 0.75-8.25 % IT SOLN
INTRATHECAL | Status: DC | PRN
  Administered 2020-01-26: 2 mL via INTRATHECAL

## 2020-01-26 MED ORDER — KETOROLAC TROMETHAMINE 30 MG/ML IJ SOLN
INTRAMUSCULAR | Status: DC | PRN
  Administered 2020-01-26: 30 mg

## 2020-01-26 MED ORDER — PROPOFOL 1000 MG/100ML IV EMUL
INTRAVENOUS | Status: AC
  Filled 2020-01-26: qty 100

## 2020-01-26 MED ORDER — ONDANSETRON HCL 4 MG/2ML IJ SOLN
INTRAMUSCULAR | Status: DC | PRN
  Administered 2020-01-26: 4 mg via INTRAVENOUS

## 2020-01-26 MED ORDER — CEFAZOLIN SODIUM-DEXTROSE 2-4 GM/100ML-% IV SOLN
2.0000 g | INTRAVENOUS | Status: AC
  Administered 2020-01-26: 2 g via INTRAVENOUS
  Filled 2020-01-26: qty 100

## 2020-01-26 MED ORDER — PHENYLEPHRINE HCL (PRESSORS) 10 MG/ML IV SOLN
INTRAVENOUS | Status: AC
  Filled 2020-01-26: qty 1

## 2020-01-26 MED ORDER — ACETAMINOPHEN 10 MG/ML IV SOLN
1000.0000 mg | Freq: Once | INTRAVENOUS | Status: AC
  Administered 2020-01-26: 1000 mg via INTRAVENOUS
  Filled 2020-01-26: qty 100

## 2020-01-26 MED ORDER — ALLOPURINOL 300 MG PO TABS
300.0000 mg | ORAL_TABLET | Freq: Every day | ORAL | Status: DC
  Administered 2020-01-27: 300 mg via ORAL
  Filled 2020-01-26 (×2): qty 1

## 2020-01-26 MED ORDER — METOCLOPRAMIDE HCL 5 MG PO TABS: 5.0000 mg | ORAL_TABLET | Freq: Three times a day (TID) | ORAL | Status: DC | PRN

## 2020-01-26 MED ORDER — TRANEXAMIC ACID-NACL 1000-0.7 MG/100ML-% IV SOLN
1000.0000 mg | INTRAVENOUS | Status: AC
  Administered 2020-01-26: 1000 mg via INTRAVENOUS
  Filled 2020-01-26: qty 100

## 2020-01-26 MED ORDER — DEXAMETHASONE SODIUM PHOSPHATE 10 MG/ML IJ SOLN
10.0000 mg | Freq: Once | INTRAMUSCULAR | Status: AC
  Administered 2020-01-27: 10 mg via INTRAVENOUS
  Filled 2020-01-26: qty 1

## 2020-01-26 MED ORDER — POLYETHYLENE GLYCOL 3350 17 G PO PACK: 17.0000 g | PACK | Freq: Every day | ORAL | Status: DC | PRN

## 2020-01-26 MED ORDER — PROPOFOL 500 MG/50ML IV EMUL
INTRAVENOUS | Status: DC | PRN
  Administered 2020-01-26: 50 ug/kg/min via INTRAVENOUS

## 2020-01-26 MED ORDER — OXYCODONE HCL 5 MG PO TABS: 5.0000 mg | ORAL_TABLET | Freq: Once | ORAL | Status: DC | PRN

## 2020-01-26 MED ORDER — LIDOCAINE HCL (CARDIAC) PF 100 MG/5ML IV SOSY
PREFILLED_SYRINGE | INTRAVENOUS | Status: DC | PRN
  Administered 2020-01-26: 20 mg via INTRAVENOUS

## 2020-01-26 MED ORDER — TAMSULOSIN HCL 0.4 MG PO CAPS
0.4000 mg | ORAL_CAPSULE | Freq: Every day | ORAL | Status: DC
  Administered 2020-01-26: 0.4 mg via ORAL
  Filled 2020-01-26: qty 1

## 2020-01-26 MED ORDER — PHENYLEPHRINE 40 MCG/ML (10ML) SYRINGE FOR IV PUSH (FOR BLOOD PRESSURE SUPPORT)
PREFILLED_SYRINGE | INTRAVENOUS | Status: DC | PRN
  Administered 2020-01-26 (×2): 120 ug via INTRAVENOUS
  Administered 2020-01-26: 160 ug via INTRAVENOUS
  Administered 2020-01-26 (×2): 120 ug via INTRAVENOUS

## 2020-01-26 MED ORDER — CELECOXIB 200 MG PO CAPS
200.0000 mg | ORAL_CAPSULE | Freq: Two times a day (BID) | ORAL | Status: DC
  Administered 2020-01-26 – 2020-01-27 (×2): 200 mg via ORAL
  Filled 2020-01-26 (×2): qty 1

## 2020-01-26 MED ORDER — HYDROCODONE-ACETAMINOPHEN 7.5-325 MG PO TABS: 1.0000 | ORAL_TABLET | ORAL | Status: DC | PRN

## 2020-01-26 MED ORDER — MENTHOL 3 MG MT LOZG: 1.0000 | LOZENGE | OROMUCOSAL | Status: DC | PRN

## 2020-01-26 MED ORDER — EPHEDRINE 5 MG/ML INJ
INTRAVENOUS | Status: AC
  Filled 2020-01-26: qty 10

## 2020-01-26 MED ORDER — BUPIVACAINE HCL (PF) 0.25 % IJ SOLN
INTRAMUSCULAR | Status: AC
  Filled 2020-01-26: qty 30

## 2020-01-26 SURGICAL SUPPLY — 75 items
BAG DECANTER FOR FLEXI CONT (MISCELLANEOUS) ×3 IMPLANT
BAG ZIPLOCK 12X15 (MISCELLANEOUS) ×3 IMPLANT
BLADE SAGITTAL 25.0X1.37X90 (BLADE) IMPLANT
BLADE SAGITTAL 25.0X1.37X90MM (BLADE)
BLADE SAW SGTL 18X1.27X75 (BLADE) IMPLANT
BLADE SAW SGTL 18X1.27X75MM (BLADE)
BNDG COHESIVE 6X5 TAN STRL LF (GAUZE/BANDAGES/DRESSINGS) ×3 IMPLANT
BRUSH FEMORAL CANAL (MISCELLANEOUS) IMPLANT
CHLORAPREP W/TINT 26 (MISCELLANEOUS) ×3 IMPLANT
COVER SURGICAL LIGHT HANDLE (MISCELLANEOUS) ×3 IMPLANT
COVER WAND RF STERILE (DRAPES) IMPLANT
DERMABOND ADVANCED (GAUZE/BANDAGES/DRESSINGS) ×2
DERMABOND ADVANCED .7 DNX12 (GAUZE/BANDAGES/DRESSINGS) ×1 IMPLANT
DRAPE HIP W/POCKET STRL (MISCELLANEOUS) ×3 IMPLANT
DRAPE IMP U-DRAPE 54X76 (DRAPES) ×6 IMPLANT
DRAPE INCISE IOBAN 66X45 STRL (DRAPES) ×6 IMPLANT
DRAPE ORTHO SPLIT 77X108 STRL (DRAPES)
DRAPE POUCH INSTRU U-SHP 10X18 (DRAPES) ×3 IMPLANT
DRAPE SHEET LG 3/4 BI-LAMINATE (DRAPES) ×9 IMPLANT
DRAPE STERI IOBAN 125X83 (DRAPES) ×3 IMPLANT
DRAPE SURG 17X11 SM STRL (DRAPES) ×3 IMPLANT
DRAPE SURG ORHT 6 SPLT 77X108 (DRAPES) IMPLANT
DRAPE U-SHAPE 47X51 STRL (DRAPES) ×6 IMPLANT
DRSG AQUACEL AG ADV 3.5X10 (GAUZE/BANDAGES/DRESSINGS) ×3 IMPLANT
DRSG AQUACEL AG ADV 3.5X14 (GAUZE/BANDAGES/DRESSINGS) ×3 IMPLANT
ELECT BLADE TIP CTD 4 INCH (ELECTRODE) ×3 IMPLANT
ELECT REM PT RETURN 15FT ADLT (MISCELLANEOUS) ×3 IMPLANT
GAUZE SPONGE 2X2 8PLY STRL LF (GAUZE/BANDAGES/DRESSINGS) IMPLANT
GLOVE BIO SURGEON STRL SZ8.5 (GLOVE) ×6 IMPLANT
GLOVE BIOGEL M STRL SZ7.5 (GLOVE) ×6 IMPLANT
GLOVE BIOGEL PI IND STRL 8 (GLOVE) ×1 IMPLANT
GLOVE BIOGEL PI IND STRL 8.5 (GLOVE) ×1 IMPLANT
GLOVE BIOGEL PI INDICATOR 8 (GLOVE) ×2
GLOVE BIOGEL PI INDICATOR 8.5 (GLOVE) ×2
GOWN SPEC L3 XXLG W/TWL (GOWN DISPOSABLE) ×3 IMPLANT
GOWN STRL REUS W/ TWL LRG LVL3 (GOWN DISPOSABLE) ×1 IMPLANT
GOWN STRL REUS W/TWL LRG LVL3 (GOWN DISPOSABLE) ×2
HANDPIECE INTERPULSE COAX TIP (DISPOSABLE) ×2
HEAD TAPER 36MM PLUS 5 (Hips) ×3 IMPLANT
HOOD PEEL AWAY FLYTE STAYCOOL (MISCELLANEOUS) ×12 IMPLANT
JET LAVAGE IRRISEPT WOUND (IRRIGATION / IRRIGATOR) ×3
KIT BASIN OR (CUSTOM PROCEDURE TRAY) ×3 IMPLANT
KIT TURNOVER KIT A (KITS) IMPLANT
LAVAGE JET IRRISEPT WOUND (IRRIGATION / IRRIGATOR) ×1 IMPLANT
LINER ACETABULAR (Hips) ×3 IMPLANT
MANIFOLD NEPTUNE II (INSTRUMENTS) ×3 IMPLANT
NDL SAFETY ECLIPSE 18X1.5 (NEEDLE) ×1 IMPLANT
NEEDLE HYPO 18GX1.5 SHARP (NEEDLE) ×2
NEEDLE SPNL 18GX3.5 QUINCKE PK (NEEDLE) ×3 IMPLANT
NS IRRIG 1000ML POUR BTL (IV SOLUTION) ×6 IMPLANT
PACK TOTAL JOINT (CUSTOM PROCEDURE TRAY) ×3 IMPLANT
PENCIL SMOKE EVACUATOR (MISCELLANEOUS) IMPLANT
PRESSURIZER FEMORAL UNIV (MISCELLANEOUS) IMPLANT
PROTECTOR NERVE ULNAR (MISCELLANEOUS) ×3 IMPLANT
RING LOCK ACET OD 58/70 (Hips) ×3 IMPLANT
SEALER BIPOLAR AQUA 6.0 (INSTRUMENTS) ×6 IMPLANT
SET HNDPC FAN SPRY TIP SCT (DISPOSABLE) ×1 IMPLANT
SPONGE GAUZE 2X2 STER 10/PKG (GAUZE/BANDAGES/DRESSINGS)
SPONGE LAP 18X18 RF (DISPOSABLE) ×3 IMPLANT
STAPLER VISISTAT 35W (STAPLE) ×3 IMPLANT
STOCKINETTE 8 INCH (MISCELLANEOUS) ×3 IMPLANT
SUCTION FRAZIER HANDLE 12FR (TUBING) ×2
SUCTION TUBE FRAZIER 12FR DISP (TUBING) ×1 IMPLANT
SUT MNCRL AB 3-0 PS2 18 (SUTURE) IMPLANT
SUT MON AB 2-0 CT1 36 (SUTURE) ×6 IMPLANT
SUT STRATAFIX PDO 1 14 VIOLET (SUTURE) ×4
SUT STRATFX PDO 1 14 VIOLET (SUTURE) ×2
SUT VIC AB 1 CT1 36 (SUTURE) ×6 IMPLANT
SUT VIC AB 2-0 CT1 27 (SUTURE) ×2
SUT VIC AB 2-0 CT1 TAPERPNT 27 (SUTURE) ×1 IMPLANT
SUTURE STRATFX PDO 1 14 VIOLET (SUTURE) ×2 IMPLANT
TOWEL OR 17X26 10 PK STRL BLUE (TOWEL DISPOSABLE) ×6 IMPLANT
TOWER CARTRIDGE SMART MIX (DISPOSABLE) IMPLANT
TRAY FOLEY MTR SLVR 16FR STAT (SET/KITS/TRAYS/PACK) ×3 IMPLANT
WATER STERILE IRR 1000ML POUR (IV SOLUTION) ×3 IMPLANT

## 2020-01-26 NOTE — Interval H&P Note (Signed)
History and Physical Interval Note:  01/26/2020 9:50 AM  Jay Washington  has presented today for surgery, with the diagnosis of Failed left total hip.  The various methods of treatment have been discussed with the patient and family. After consideration of risks, benefits and other options for treatment, the patient has consented to  Procedure(s): TOTAL HIP REVISION head ball and liner exchange (Left) as a surgical intervention.  The patient's history has been reviewed, patient examined, no change in status, stable for surgery.  I have reviewed the patient's chart and labs.  Questions were answered to the patient's satisfaction.    The risks, benefits, and alternatives were discussed with the patient. There are risks associated with the surgery including, but not limited to, problems with anesthesia (death), infection, instability (giving out of the joint), dislocation, differences in leg length/angulation/rotation, fracture of bones, loosening or failure of implants, hematoma (blood accumulation) which may require surgical drainage, blood clots, pulmonary embolism, nerve injury (foot drop and lateral thigh numbness), and blood vessel injury. The patient understands these risks and elects to proceed.    Hilton Cork Jeramia Saleeby

## 2020-01-26 NOTE — Evaluation (Signed)
Physical Therapy Evaluation Patient Details Name: Jay Washington MRN: [id number] DOB: 03/01/1940 Today's Date: 01/26/2020   History of Present Illness  Patient is 79 y.o. male s/p Lt THR (ball and liner exchange) on 01/26/20 with PMH significant for HTN, hypothyroidism, GERD, OA, gout.    Clinical Impression  Jay Washington is a 79 y.o. male POD 0 s/p Lt THR. Patient reports independence with mobility at baseline. Patient is now limited by functional impairments (see PT problem list below) and requires min assist for transfers and gait with RW. Patient was able to ambulate ~60 feet with RW and min assist. Patient instructed in exercise to facilitate circulation. Patient will benefit from continued skilled PT interventions to address impairments and progress towards PLOF. Acute PT will follow to progress mobility and stair training in preparation for safe discharge home.     Follow Up Recommendations Follow surgeon's recommendation for DC plan and follow-up therapies    Equipment Recommendations  None recommended by PT    Recommendations for Other Services       Precautions / Restrictions Precautions Precautions: Fall Restrictions Weight Bearing Restrictions: No Other Position/Activity Restrictions: WBAT      Mobility  Bed Mobility Overal bed mobility: Needs Assistance Bed Mobility: Supine to Sit     Supine to sit: HOB elevated;Min assist     General bed mobility comments: pt taking extra time, cues to use bed rail and assist to raise trunk fully.    Transfers Overall transfer level: Needs assistance Equipment used: Rolling walker (2 wheeled) Transfers: Sit to/from Stand Sit to Stand: Min assist         General transfer comment: cues for hand placement/technique, assist for power up and to steady with rise.  Ambulation/Gait Ambulation/Gait assistance: Min assist Gait Distance (Feet): 60 Feet Assistive device: Rolling walker (2 wheeled) Gait Pattern/deviations:  Step-to pattern;Decreased stride length;Decreased weight shift to left Gait velocity: decr   General Gait Details: cues for safe step pattern and proximity to RW. min assist required to manage walker position throughout.  Stairs            Wheelchair Mobility    Modified Rankin (Stroke Patients Only)       Balance Overall balance assessment: Needs assistance Sitting-balance support: Feet supported Sitting balance-[name]: Good     Standing balance support: During functional activity;Bilateral upper extremity supported Standing balance-[name]: Poor                               Pertinent Vitals/Pain Pain Assessment: 0-10 Pain Score: 3  Pain Location: Lt hip Pain Descriptors / Indicators: Aching;Discomfort;Burning Pain Intervention(s): Limited activity within patient's tolerance;Monitored during session;Repositioned    Home Living Family/patient expects to be discharged to:: Private residence Living Arrangements: Spouse/significant other Available Help at Discharge: Family Type of Home: House Home Access: Stairs to enter Entrance Stairs-Rails: Left Entrance Stairs-Number of Steps: 3 Home Layout: One level Home Equipment: Environmental consultant - 2 wheels;Cane - single point;Bedside commode;Shower seat      Prior Function Level of Independence: Independent               Hand Dominance        Extremity/Trunk Assessment   Upper Extremity Assessment Upper Extremity Assessment: Overall WFL for tasks assessed    Lower Extremity Assessment Lower Extremity Assessment: Overall WFL for tasks assessed    Cervical / Trunk Assessment Cervical / Trunk Assessment: Normal  Communication  Communication: No difficulties  Cognition Arousal/Alertness: Awake/alert Behavior During Therapy: WFL for tasks assessed/performed Overall Cognitive Status: Within Functional Limits for tasks assessed                                        General  Comments      Exercises Total Joint Exercises Ankle Circles/Pumps: AROM;Both;20 reps;Seated   Assessment/Plan    PT Assessment Patient needs continued PT services  PT Problem List Decreased strength;Decreased range of motion;Decreased mobility;Decreased balance;Decreased activity tolerance;Decreased knowledge of use of DME;Decreased knowledge of precautions       PT Treatment Interventions DME instruction;Gait training;Stair training;Functional mobility training;Therapeutic activities;Therapeutic exercise;Balance training;Patient/family education    PT Goals (Current goals can be found in the Care Plan section)  Acute Rehab PT Goals Patient Stated Goal: regain independence PT Goal Formulation: With patient Time For Goal Achievement: 02/02/20 Potential to Achieve Goals: Good    Frequency 7X/week   Barriers to discharge        Co-evaluation               AM-PAC PT "6 Clicks" Mobility  Outcome Measure Help needed turning from your back to your side while in a flat bed without using bedrails?: A Little Help needed moving from lying on your back to sitting on the side of a flat bed without using bedrails?: A Little Help needed moving to and from a bed to a chair (including a wheelchair)?: A Little Help needed standing up from a chair using your arms (e.g., wheelchair or bedside chair)?: A Little Help needed to walk in hospital room?: A Little Help needed climbing 3-5 steps with a railing? : A Little 6 Click Score: 18    End of Session Equipment Utilized During Treatment: Gait belt Activity Tolerance: Patient tolerated treatment well Patient left: in chair;with call bell/phone within reach;with chair alarm set;with family/visitor present Nurse Communication: Mobility status PT Visit Diagnosis: Muscle weakness (generalized) (M62.81);Difficulty in walking, not elsewhere classified (R26.2)    Time: 8841-6606 PT Time Calculation (min) (ACUTE ONLY): 23 min   Charges:    PT Evaluation $PT Eval Low Complexity: 1 Low PT Treatments $Gait Training: 8-22 mins        Verner Mould, DPT Acute Rehabilitation Services  Office [phone] Pager [phone]  01/26/2020 5:43 PM

## 2020-01-26 NOTE — Anesthesia Preprocedure Evaluation (Addendum)
Anesthesia Evaluation  Patient identified by MRN, date of birth, ID band Patient awake    Reviewed: Allergy & Precautions, NPO status , Patient's Chart, lab work & pertinent test results  History of Anesthesia Complications (+) PONV  Airway Mallampati: II  TM Distance: >3 FB Neck ROM: Full    Dental  (+) Dental Advisory Given   Pulmonary neg pulmonary ROS,  01/23/2020 SARS coronavirus NEG   breath sounds clear to auscultation       Cardiovascular hypertension, Pt. on medications (-) angina Rhythm:Regular Rate:Normal     Neuro/Psych negative neurological ROS     GI/Hepatic Neg liver ROS, GERD  Controlled and Medicated,  Endo/Other  Hypothyroidism   Renal/GU Renal InsufficiencyRenal disease (creat 1.43)     Musculoskeletal  (+) Arthritis ,   Abdominal   Peds  Hematology   Anesthesia Other Findings   Reproductive/Obstetrics                            Anesthesia Physical Anesthesia Plan  ASA: II  Anesthesia Plan: Spinal   Post-op Pain Management:    Induction:   PONV Risk Score and Plan: 2 and Treatment may vary due to age or medical condition and Ondansetron  Airway Management Planned: Simple Face Mask and Natural Airway  Additional Equipment: None  Intra-op Plan:   Post-operative Plan:   Informed Consent: I have reviewed the patients History and Physical, chart, labs and discussed the procedure including the risks, benefits and alternatives for the proposed anesthesia with the patient or authorized representative who has indicated his/her understanding and acceptance.     Dental advisory given  Plan Discussed with: CRNA and Surgeon  Anesthesia Plan Comments:        Anesthesia Quick Evaluation

## 2020-01-26 NOTE — Plan of Care (Signed)
Plan of care reviewed and discussed with the patient. 

## 2020-01-26 NOTE — Transfer of Care (Signed)
Immediate Anesthesia Transfer of Care Note  Patient: ZYERE JIMINEZ  Procedure(s) Performed: TOTAL HIP REVISION head ball and liner exchange (Left Hip)  Patient Location: PACU  Anesthesia Type:Spinal  Level of Consciousness: awake and drowsy  Airway & Oxygen Therapy: Patient Spontanous Breathing and Patient connected to face mask oxygen  Post-op Assessment: Report given to RN and Post -op Vital signs reviewed and stable  Post vital signs: Reviewed and stable  Last Vitals:  Vitals Value Taken Time  BP 128/68 01/26/20 1347  Temp    Pulse 86 01/26/20 1348  Resp 17 01/26/20 1348  SpO2 95 % 01/26/20 1348  Vitals shown include unvalidated device data.  Last Pain:  Vitals:   01/26/20 0911  TempSrc:   PainSc: 0-No pain         Complications: No complications documented.

## 2020-01-26 NOTE — Op Note (Signed)
OPERATIVE REPORT   SURGEON: Rod Can, MD   ASSISTANT: Cherlynn June, PA-C.  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   PROCEDURE: Revision Left total hip arthroplasty head ball and liner exchange, anterior approach.   IMPLANTS: DePuy Duraloc liner, size 36 x 58 mm +4 neutral mm. DePuy metal head ball, size 36 + 5 mm, 14/16 taper.  EXPLANTS: DePuy Duraloc liner, size 32 x 58 mm +4 neutral mm. Ceramic head ball, size 32 mm, 14/16 taper.  ANESTHESIA:  Spinal  ESTIMATED BLOOD LOSS:-450 mL    ANTIBIOTICS: 2g Ancef. 1g Vancomycin.  DRAINS: None.  COMPLICATIONS: None.   CONDITION: PACU - hemodynamically stable.   BRIEF CLINICAL NOTE: Jay Washington is a 79 y.o. male with a remote history of Left total hip arthroplasty about 18 years ago. He presented with acute onset left hip pain. X-rays showed severe eccentric poly liner wear. Operative notes were not available from the hospital. The patient was indicated for total hip arthroplasty. The risks, benefits, and alternatives to the procedure were explained, and the patient elected to proceed.  PROCEDURE IN DETAIL: Surgical site was marked by myself in the pre-op holding area. Once inside the operating room, spinal anesthesia was obtained, and a foley catheter was inserted. The patient was then positioned on the Hana table.  All bony prominences were well padded.  The hip was prepped and draped in the normal sterile surgical fashion.  A time-out was called verifying side and site of surgery. The patient received IV antibiotics within 60 minutes of beginning the procedure.   The direct anterior approach to the hip was performed through the Hueter interval.  Lateral femoral circumflex vessels were treated with the Auqumantys. The anterior capsule was exposed and an inverted T capsulotomy was  made. Joint fluid was clear and straw colored. There was no evidence of infection. Using a bone tamp, I removed the ceramic head, which was labeled 32 mm 14/16 taper. The stem was stable. I inspected the trunnion which had mild corrosion but no significant damage. The trunnion was cleaned with a lap sponge. A synovectomy was performed and the trunnion was tucked inferiorly under the cup.   Acetabular exposure was achieved. I removed the poly liner with the poly liner removal tool. There was severe superior wear. I removed the locking ring. I debrided the screw holes with a curette. The cup was stable. A new locking ring was placed followed by the new liner.   I placed a trial head ball.  The hip was reduced.  Leg lengths and offset were checked fluoroscopically.  The hip was dislocated and trial components were removed.  The final head ball was placed, and the hip was reduced.  Fluoroscopy was used to confirm component position and leg lengths.  At 90 degrees of external rotation and full extension, the hip was stable to an anterior directed force. At neutral abduction and 90 degrees of flexion, the hip was stable with over 60 degrees of internal rotation.   The wound was copiously irrigated with Irrisept solution and normal saline using pule lavage.  Marcaine solution was injected into the periarticular soft tissue.  The wound was closed in layers using #1 Stratafix for the fascia, 2-0 Vicryl for the subcutaneous fat, 2-0 Monocryl for the deep dermal layer, 3-0 running Monocryl subcuticular stitch, and Dermabond for the skin.  Once the glue was  fully dried, an Aquacell Ag dressing was applied.  The patient was transported to the recovery room in stable condition.  Sponge, needle, and instrument counts were correct at the end of the case x2.  The patient tolerated the procedure well and there were no known complications.  Please note that a surgical assistant was a medical necessity for this procedure to  perform it in a safe and expeditious manner. Assistant was necessary to provide appropriate retraction of vital neurovascular structures, to prevent femoral fracture, and to allow for anatomic placement of the prosthesis.

## 2020-01-26 NOTE — Anesthesia Procedure Notes (Signed)
Spinal  Patient location during procedure: OR Start time: 01/26/2020 11:03 AM End time: 01/26/2020 11:09 AM Staffing Performed: resident/CRNA  Anesthesiologist: Annye Asa, MD Resident/CRNA: Raenette Rover, CRNA Preanesthetic Checklist Completed: patient identified, IV checked, site marked, risks and benefits discussed, surgical consent, monitors and equipment checked, pre-op evaluation and timeout performed Spinal Block Patient position: sitting Prep: DuraPrep Patient monitoring: blood pressure, continuous pulse ox and heart rate Approach: midline Location: L3-4 Injection technique: single-shot Needle Needle type: Pencan  Needle gauge: 24 G Assessment Sensory level: T4

## 2020-01-26 NOTE — Progress Notes (Signed)
Recd from pacu

## 2020-01-26 NOTE — Interval H&P Note (Signed)
History and Physical Interval Note:  01/26/2020 9:50 AM  Jay Washington  has presented today for surgery, with the diagnosis of Failed left total hip.  The various methods of treatment have been discussed with the patient and family. After consideration of risks, benefits and other options for treatment, the patient has consented to  Procedure(s): TOTAL HIP REVISION head ball and liner exchange (Left) as a surgical intervention.  The patient's history has been reviewed, patient examined, no change in status, stable for surgery.  I have reviewed the patient's chart and labs.  Questions were answered to the patient's satisfaction.    The risks, benefits, and alternatives were discussed with the patient. There are risks associated with the surgery including, but not limited to, problems with anesthesia (death), infection, instability (giving out of the joint), dislocation, differences in leg length/angulation/rotation, fracture of bones, loosening or failure of implants, hematoma (blood accumulation) which may require surgical drainage, blood clots, pulmonary embolism, nerve injury (foot drop and lateral thigh numbness), and blood vessel injury. The patient understands these risks and elects to proceed.    Hilton Cork Jesika Men

## 2020-01-26 NOTE — Discharge Instructions (Signed)
°Dr. Jayvan Mcshan °Joint Replacement Specialist °[facility] °[address] °[place], [place] [address] °[phone] ° ° °TOTAL HIP REPLACEMENT POSTOPERATIVE DIRECTIONS ° ° ° °Hip Rehabilitation, Guidelines Following Surgery  ° °WEIGHT BEARING °Weight bearing as tolerated with assist device (walker, cane, etc) as directed, use it as long as suggested by your surgeon or therapist, typically at least 4-6 weeks. ° °The results of a hip operation are greatly improved after range of motion and muscle strengthening exercises. Follow all safety measures which are given to protect your hip. If any of these exercises cause increased pain or swelling in your joint, decrease the amount until you are comfortable again. Then slowly increase the exercises. Call your caregiver if you have problems or questions.  ° °HOME CARE INSTRUCTIONS  °Most of the following instructions are designed to prevent the dislocation of your new hip.  °Remove items at home which could result in a fall. This includes throw rugs or furniture in walking pathways.  °Continue medications as instructed at time of discharge. °· You may have some home medications which will be placed on hold until you complete the course of blood thinner medication. °· You may start showering once you are discharged home. Do not remove your dressing. °Do not put on socks or shoes without following the instructions of your caregivers.   °Sit on chairs with arms. Use the chair arms to help push yourself up when arising.  °Arrange for the use of a toilet seat elevator so you are not sitting low.  °· Walk with walker as instructed.  °You may resume a sexual relationship in one month or when given the OK by your caregiver.  °Use walker as long as suggested by your caregivers.  °You may put full weight on your legs and walk as much as is comfortable. °Avoid periods of inactivity such as sitting longer than an hour when not asleep. This helps prevent  blood clots.  °You may return to work once you are cleared by your surgeon.  °Do not drive a car for 6 weeks or until released by your surgeon.  °Do not drive while taking narcotics.  °Wear elastic stockings for two weeks following surgery during the day but you may remove then at night.  °Make sure you keep all of your appointments after your operation with all of your doctors and caregivers. You should call the office at the above phone number and make an appointment for approximately two weeks after the date of your surgery. °Please pick up a stool softener and laxative for home use as long as you are requiring pain medications. °· ICE to the affected hip every three hours for 30 minutes at a time and then as needed for pain and swelling. Continue to use ice on the hip for pain and swelling from surgery. You may notice swelling that will progress down to the foot and ankle.  This is normal after surgery.  Elevate the leg when you are not up walking on it.   °It is important for you to complete the blood thinner medication as prescribed by your doctor. °· Continue to use the breathing machine which will help keep your temperature down.  It is common for your temperature to cycle up and down following surgery, especially at night when you are not up moving around and exerting yourself.  The breathing machine keeps your lungs expanded and your temperature down. ° °RANGE OF MOTION AND STRENGTHENING EXERCISES  °These exercises are   designed to help you keep full movement of your hip joint. Follow your caregiver's or physical therapist's instructions. Perform all exercises about fifteen times, three times per day or as directed. Exercise both hips, even if you have had only one joint replacement. These exercises can be done on a training (exercise) mat, on the floor, on a table or on a bed. Use whatever works the best and is most comfortable for you. Use music or television while you are exercising so that the exercises  are a pleasant break in your day. This will make your life better with the exercises acting as a break in routine you can look forward to.  °Lying on your back, slowly slide your foot toward your buttocks, raising your knee up off the floor. Then slowly slide your foot back down until your leg is straight again.  °Lying on your back spread your legs as far apart as you can without causing discomfort.  °Lying on your side, raise your upper leg and foot straight up from the floor as far as is comfortable. Slowly lower the leg and repeat.  °Lying on your back, tighten up the muscle in the front of your thigh (quadriceps muscles). You can do this by keeping your leg straight and trying to raise your heel off the floor. This helps strengthen the largest muscle supporting your knee.  °Lying on your back, tighten up the muscles of your buttocks both with the legs straight and with the knee bent at a comfortable angle while keeping your heel on the floor.  ° °SKILLED REHAB INSTRUCTIONS: °If the patient is transferred to a skilled rehab facility following release from the hospital, a list of the current medications will be sent to the facility for the patient to continue.  When discharged from the skilled rehab facility, please have the facility set up the patient's Home Health Physical Therapy prior to being released. Also, the skilled facility will be responsible for providing the patient with their medications at time of release from the facility to include their pain medication and their blood thinner medication. If the patient is still at the rehab facility at time of the two week follow up appointment, the skilled rehab facility will also need to assist the patient in arranging follow up appointment in our office and any transportation needs. ° °MAKE SURE YOU:  °Understand these instructions.  °Will watch your condition.  °Will get help right away if you are not doing well or get worse. ° °Pick up stool softner and  laxative for home use following surgery while on pain medications. °Do not remove your dressing. °The dressing is waterproof--it is OK to take showers. °Continue to use ice for pain and swelling after surgery. °Do not use any lotions or creams on the incision until instructed by your surgeon. °Total Hip Protocol. ° ° °

## 2020-01-26 NOTE — Anesthesia Postprocedure Evaluation (Signed)
Anesthesia Post Note  Patient: SEVERUS BRODZINSKI  Procedure(s) Performed: TOTAL HIP REVISION head ball and liner exchange (Left Hip)     Patient location during evaluation: PACU Anesthesia Type: Spinal Level of consciousness: awake and alert, patient cooperative and oriented Pain management: pain level controlled Vital Signs Assessment: post-procedure vital signs reviewed and stable Respiratory status: spontaneous breathing, nonlabored ventilation and respiratory function stable Cardiovascular status: blood pressure returned to baseline and stable Postop Assessment: no apparent nausea or vomiting, patient able to bend at knees and spinal receding Anesthetic complications: no   No complications documented.  Last Vitals:  Vitals:   01/26/20 1445 01/26/20 1500  BP:  (!) 146/65  Pulse: 76 74  Resp: 12   Temp: 36.4 C (!) 36.4 C  SpO2: 98% 98%    Last Pain:  Vitals:   01/26/20 1500  TempSrc: Oral  PainSc:                  Cinzia Devos,E. Massiah Longanecker

## 2020-01-27 ENCOUNTER — Encounter (HOSPITAL_COMMUNITY): Payer: Self-pay | Admitting: Orthopedic Surgery

## 2020-01-27 DIAGNOSIS — T84061A Wear of articular bearing surface of internal prosthetic left hip joint, initial encounter: Secondary | ICD-10-CM | POA: Diagnosis not present

## 2020-01-27 LAB — CBC
HCT: 38.8 % — ABNORMAL LOW (ref 39.0–52.0)
Hemoglobin: 13.1 g/dL (ref 13.0–17.0)
MCH: 31.6 pg (ref 26.0–34.0)
MCHC: 33.8 g/dL (ref 30.0–36.0)
MCV: 93.5 fL (ref 80.0–100.0)
Platelets: 195 10*3/uL (ref 150–400)
RBC: 4.15 MIL/uL — ABNORMAL LOW (ref 4.22–5.81)
RDW: 13.5 % (ref 11.5–15.5)
WBC: 10.8 10*3/uL — ABNORMAL HIGH (ref 4.0–10.5)
nRBC: 0 % (ref 0.0–0.2)

## 2020-01-27 LAB — BASIC METABOLIC PANEL
Anion gap: 10 (ref 5–15)
BUN: 23 mg/dL (ref 8–23)
CO2: 21 mmol/L — ABNORMAL LOW (ref 22–32)
Calcium: 8.7 mg/dL — ABNORMAL LOW (ref 8.9–10.3)
Chloride: 108 mmol/L (ref 98–111)
Creatinine, Ser: 1.44 mg/dL — ABNORMAL HIGH (ref 0.61–1.24)
GFR, Estimated: 49 mL/min — ABNORMAL LOW (ref >60)
Glucose, Bld: 118 mg/dL — ABNORMAL HIGH (ref 70–99)
Potassium: 4.4 mmol/L (ref 3.5–5.1)
Sodium: 139 mmol/L (ref 135–145)

## 2020-01-27 MED ORDER — ASPIRIN 81 MG PO CHEW: 81.0000 mg | CHEWABLE_TABLET | Freq: Two times a day (BID) | ORAL | 0 refills | Status: AC

## 2020-01-27 MED ORDER — DOCUSATE SODIUM 100 MG PO CAPS: 100.0000 mg | ORAL_CAPSULE | Freq: Two times a day (BID) | ORAL | 0 refills | Status: DC

## 2020-01-27 MED ORDER — ONDANSETRON HCL 4 MG PO TABS: 4.0000 mg | ORAL_TABLET | Freq: Four times a day (QID) | ORAL | 0 refills | Status: DC | PRN

## 2020-01-27 MED ORDER — HYDROCODONE-ACETAMINOPHEN 5-325 MG PO TABS: 1.0000 | ORAL_TABLET | ORAL | 0 refills | Status: DC | PRN

## 2020-01-27 MED ORDER — SENNA 8.6 MG PO TABS: 1.0000 | ORAL_TABLET | Freq: Two times a day (BID) | ORAL | 0 refills | Status: DC

## 2020-01-27 NOTE — Discharge Summary (Signed)
Physician Discharge Summary  Patient ID: Jay Washington MRN: [id number] DOB/AGE: [birth year] 79 y.o.  Admit date: 01/26/2020 Discharge date: 01/27/2020  Admission Diagnoses:  Failed total hip arthroplasty  Discharge Diagnoses:  Active Problems:   Failed total hip arthroplasty [facility])   Past Medical History:  Diagnosis Date  . Arthritis    hands,   . Complication of anesthesia   . GERD (gastroesophageal reflux disease)   . Gout   . Hypertension   . Hypothyroidism   . PONV (postoperative nausea and vomiting)     Surgeries: Procedure(s): TOTAL HIP REVISION head ball and liner exchange on 01/26/2020   Consultants (if any):   Discharged Condition: Improved  Hospital Course: Jay Washington is an 79 y.o. male who was admitted 01/26/2020 with a diagnosis of failed total hip arthoplasty and went to the operating room on 01/26/2020 and underwent the above named procedures.    He was given perioperative antibiotics:  Anti-infectives (From admission, onward)   Start     Dose/Rate Route Frequency Ordered Stop   01/26/20 1700  ceFAZolin (ANCEF) IVPB 2g/100 mL premix        2 g 200 mL/hr over 30 Minutes Intravenous Every 6 hours 01/26/20 1504 01/26/20 2259   01/26/20 1147  vancomycin (VANCOCIN) 1-5 GM/200ML-% IVPB       Note to Pharmacy: Jovita Gamma   : cabinet override      01/26/20 1147 01/26/20 2359   01/26/20 0845  ceFAZolin (ANCEF) IVPB 2g/100 mL premix        2 g 200 mL/hr over 30 Minutes Intravenous On call to O.R. 01/26/20 3664 01/26/20 1146    .  He was given sequential compression devices, early ambulation, and aspirin for DVT prophylaxis.  He benefited maximally from the hospital stay and there were no complications.    Recent vital signs:  Vitals:   01/27/20 0602 01/27/20 0925  BP: (!) 152/64 (!) 152/57  Pulse: 67 62  Resp: 17 18  Temp: 98.2 F (36.8 C) (!) 97.5 F (36.4 C)  SpO2: 97% 96%    Recent laboratory studies:  Lab Results  Component Value Date    HGB 13.1 01/27/2020   HGB 15.8 01/20/2020   Lab Results  Component Value Date   WBC 10.8 (H) 01/27/2020   PLT 195 01/27/2020   Lab Results  Component Value Date   INR 1.1 01/20/2020   Lab Results  Component Value Date   NA 139 01/27/2020   K 4.4 01/27/2020   CL 108 01/27/2020   CO2 21 (L) 01/27/2020   BUN 23 01/27/2020   CREATININE 1.44 (H) 01/27/2020   GLUCOSE 118 (H) 01/27/2020    Discharge Medications:   Allergies as of 01/27/2020   No Known Allergies     Medication List    STOP taking these medications   aspirin EC 81 MG tablet Replaced by: aspirin 81 MG chewable tablet     TAKE these medications   allopurinol 300 MG tablet Commonly known as: ZYLOPRIM Take 300 mg by mouth daily.   amLODipine 5 MG tablet Commonly known as: NORVASC Take 5 mg by mouth daily.   aspirin 81 MG chewable tablet Chew 1 tablet (81 mg total) by mouth 2 (two) times daily with a meal. Replaces: aspirin EC 81 MG tablet   cholecalciferol 25 MCG (1000 UNIT) tablet Commonly known as: VITAMIN D3 Take 1,000 Units by mouth daily.   clotrimazole-betamethasone cream Commonly known as: LOTRISONE Apply 1 application topically 2 (two)  times daily as needed (irritation).   docusate sodium 100 MG capsule Commonly known as: COLACE Take 1 capsule (100 mg total) by mouth 2 (two) times daily.   finasteride 5 MG tablet Commonly known as: PROSCAR Take 5 mg by mouth daily.   HYDROcodone-acetaminophen 5-325 MG tablet Commonly known as: NORCO/VICODIN Take 1-2 tablets by mouth every 4 (four) hours as needed for moderate pain (pain score 4-6).   levothyroxine 25 MCG tablet Commonly known as: SYNTHROID Take 25 mcg by mouth daily before breakfast.   losartan 100 MG tablet Commonly known as: COZAAR Take 100 mg by mouth daily.   ondansetron 4 MG tablet Commonly known as: ZOFRAN Take 1 tablet (4 mg total) by mouth every 6 (six) hours as needed for nausea.   pravastatin 40 MG  tablet Commonly known as: PRAVACHOL Take 40 mg by mouth daily.   senna 8.6 MG Tabs tablet Commonly known as: SENOKOT Take 1 tablet (8.6 mg total) by mouth 2 (two) times daily.   tamsulosin 0.4 MG Caps capsule Commonly known as: FLOMAX Take 0.4 mg by mouth at bedtime.       Diagnostic Studies: DG Pelvis Portable  Result Date: 01/26/2020 CLINICAL DATA:  Status post left hip replacement. EXAM: PORTABLE PELVIS 1-2 VIEWS COMPARISON:  None. FINDINGS: The left femoral and acetabular components are well situated. Expected postoperative changes are seen in the surrounding soft tissues. IMPRESSION: Status post left total hip arthroplasty. Electronically Signed   By: Marijo Conception M.D.   On: 01/26/2020 14:44   DG C-Arm 1-60 Min-No Report  Result Date: 01/26/2020 CLINICAL DATA:  Surgery left hip EXAM: OPERATIVE left HIP (WITH PELVIS IF PERFORMED)  VIEWS TECHNIQUE: Fluoroscopic spot image(s) were submitted for interpretation post-operatively. COMPARISON:  X-ray pelvis 12/30/2019. FINDINGS: Intraoperative left total hip arthroplasty revision. Two low resolution intraoperative spot views of the left hip were obtained. No fracture visible on the limited views. Total fluoroscopy time: 6 seconds Total radiation dose: 1.03 mGy IMPRESSION: Intraoperative left total hip arthroplasty revision. Electronically Signed   By: Iven Finn M.D.   On: 01/26/2020 15:07   DG HIP OPERATIVE UNILAT W OR W/O PELVIS LEFT  Result Date: 01/26/2020 CLINICAL DATA:  Surgery left hip EXAM: OPERATIVE left HIP (WITH PELVIS IF PERFORMED)  VIEWS TECHNIQUE: Fluoroscopic spot image(s) were submitted for interpretation post-operatively. COMPARISON:  X-ray pelvis 12/30/2019. FINDINGS: Intraoperative left total hip arthroplasty revision. Two low resolution intraoperative spot views of the left hip were obtained. No fracture visible on the limited views. Total fluoroscopy time: 6 seconds Total radiation dose: 1.03 mGy IMPRESSION:  Intraoperative left total hip arthroplasty revision. Electronically Signed   By: Iven Finn M.D.   On: 01/26/2020 15:07    Disposition: Discharge disposition: 01-Home or Self Care     Dental Antibiotics:  In most cases prophylactic antibiotics for Dental procdeures after total joint surgery are not necessary.  Exceptions are as follows:  1. History of prior total joint infection  2. Severely immunocompromised (Organ Transplant, cancer chemotherapy, Rheumatoid biologic meds such as [place])  3. Poorly controlled diabetes (A1C &gt; 8.0, blood glucose over 200)  If you have one of these conditions, contact your surgeon for an antibiotic prescription, prior to your dental procedure.  Discharge Instructions    Call MD / Call 911   Complete by: As directed    If you experience chest pain or shortness of breath, CALL 911 and be transported to the hospital emergency room.  If you develope a fever above  4 F, pus (white drainage) or increased drainage or redness at the wound, or calf pain, call your surgeon's office.   Constipation Prevention   Complete by: As directed    Drink plenty of fluids.  Prune juice may be helpful.  You may use a stool softener, such as Colace (over the counter) 100 mg twice a day.  Use MiraLax (over the counter) for constipation as needed.   Diet - low sodium heart healthy   Complete by: As directed    Driving restrictions   Complete by: As directed    No driving for 6 weeks   Increase activity slowly as tolerated   Complete by: As directed    Lifting restrictions   Complete by: As directed    No lifting for 6 weeks   TED hose   Complete by: As directed    Use stockings (TED hose) for 6 weeks on both leg(s).  You may remove them at night for sleeping.       Follow-up Information    Swinteck, Aaron Edelman, MD. Schedule an appointment as soon as possible for a visit in 2 weeks.   Specialty: Orthopedic Surgery Why: For wound re-check Contact  information: [address] [place] [address] [phone]                Signed: [name] 01/27/2020, 1:39 PM

## 2020-01-27 NOTE — Progress Notes (Addendum)
Physical Therapy Treatment Patient Details Name: Jay Washington MRN: [id number] DOB: 03-17-1940 Today's Date: 01/27/2020    History of Present Illness Patient is 79 y.o. male s/p Lt THR (ball and liner exchange) on 01/26/20 with PMH significant for HTN, hypothyroidism, GERD, OA, gout.    PT Comments    Pt making excellent progress.  Ready for d/c from PT standpoint.   Follow Up Recommendations  Follow surgeon's recommendation for DC plan and follow-up therapies     Equipment Recommendations  None recommended by PT    Recommendations for Other Services       Precautions / Restrictions Precautions Precautions: Fall Restrictions Weight Bearing Restrictions: No LLE Weight Bearing: Weight bearing as tolerated Other Position/Activity Restrictions: WBAT    Mobility  Bed Mobility Overal bed mobility: Needs Assistance Bed Mobility: Supine to Sit     Supine to sit: Supervision     General bed mobility comments: for safety  Transfers Overall transfer level: Needs assistance Equipment used: Rolling walker (2 wheeled) Transfers: Sit to/from Stand Sit to Stand: Supervision         General transfer comment: cues for hand placement/technique  Ambulation/Gait Ambulation/Gait assistance: Supervision;Min guard Gait Distance (Feet): 250 Feet Assistive device: Rolling walker (2 wheeled) Gait Pattern/deviations: Decreased stride length;Decreased weight shift to left Gait velocity: decr   General Gait Details: cues for initial sequence, good carryover from previous session   Stairs Stairs: Yes Stairs assistance: Min guard;Supervision Stair Management: One rail Right;Step to pattern;Forwards Number of Stairs: 3 General stair comments: cues for sequence, pt familiar with technique from recent THA   Wheelchair Mobility    Modified Rankin (Stroke Patients Only)       Balance Overall balance assessment: Needs assistance Sitting-balance support: Feet  supported Sitting balance-[name]: Good     Standing balance support: During functional activity;Bilateral upper extremity supported Standing balance-[name]: Fair Standing balance comment: able to static stand without UE support, no LOB                            Cognition Arousal/Alertness: Awake/alert Behavior During Therapy: WFL for tasks assessed/performed Overall Cognitive Status: Within Functional Limits for tasks assessed                                 General Comments: very pleasant and cooperative      Exercises Total Joint Exercises Ankle Circles/Pumps: AROM;Both;20 reps;Seated Reviewed THA HEP Quad sets x5 Heel slides x5 Hib abd/add x10 demo'd LAQ Handout given    General Comments        Pertinent Vitals/Pain Pain Assessment: 0-10 Pain Score: 1  Pain Location: Lt hip Pain Descriptors / Indicators: Discomfort Pain Intervention(s): Limited activity within patient's tolerance;Monitored during session;Repositioned;Ice applied    Home Living                      Prior Function            PT Goals (current goals can now be found in the care plan section) Acute Rehab PT Goals Patient Stated Goal: regain independence PT Goal Formulation: With patient Time For Goal Achievement: 02/02/20 Potential to Achieve Goals: Good Progress towards PT goals: Progressing toward goals    Frequency    7X/week      PT Plan      Co-evaluation  AM-PAC PT "6 Clicks" Mobility   Outcome Measure  Help needed turning from your back to your side while in a flat bed without using bedrails?: A Little Help needed moving from lying on your back to sitting on the side of a flat bed without using bedrails?: A Little Help needed moving to and from a bed to a chair (including a wheelchair)?: A Little Help needed standing up from a chair using your arms (e.g., wheelchair or bedside chair)?: A Little Help needed to  walk in hospital room?: A Little Help needed climbing 3-5 steps with a railing? : A Little 6 Click Score: 18    End of Session Equipment Utilized During Treatment: Gait belt Activity Tolerance: Patient tolerated treatment well Patient left: in chair;with call bell/phone within reach;with chair alarm set;with family/visitor present Nurse Communication: Mobility status PT Visit Diagnosis: Muscle weakness (generalized) (M62.81);Difficulty in walking, not elsewhere classified (R26.2)     Time: 7741-4239 PT Time Calculation (min) (ACUTE ONLY): 10 min  Charges:  $Gait Training: 8-22 mins                     Baxter Flattery, PT  Acute Rehab Dept ([facility]) [phone] Pager [phone]  01/27/2020    [facility] 01/27/2020, 10:50 AM

## 2020-01-27 NOTE — TOC Transition Note (Signed)
Transition of Care [facility]) - CM/SW Discharge Note   Patient Details  Name: VENTURA HOLLENBECK MRN: [id number] Date of Birth: [birth year]  [facility]) CM/SW Contact:  Lia Hopping, LCSW Phone Number: 01/27/2020, 10:57 AM   Clinical Narrative:    Therapy Plan: HEP Patient confirm he has DME.-RW and 3 IN1.    Final next level of care: Home/Self Care Barriers to Discharge: No Barriers Identified   Patient Goals and CMS Choice        Discharge Placement                       Discharge Plan and Services                                     Social Determinants of Health (SDOH) Interventions     Readmission Risk Interventions No flowsheet data found.

## 2020-01-27 NOTE — Progress Notes (Signed)
    Subjective:  Patient reports pain as mild to moderate.  Denies N/V/CP/SOB.   Objective:   VITALS:   Vitals:   01/26/20 2154 01/27/20 0126 01/27/20 0602 01/27/20 0925  BP: (!) 153/65 (!) 145/71 (!) 152/64 (!) 152/57  Pulse: 98 69 67 62  Resp: 16 18 17 18   Temp: (!) 97.3 F (36.3 C) 97.6 F (36.4 C) 98.2 F (36.8 C) (!) 97.5 F (36.4 C)  TempSrc: Oral Oral Oral Oral  SpO2: 98% 97% 97% 96%  Weight:      Height:        NAD ABD soft Neurovascular intact Sensation intact distally Intact pulses distally Dorsiflexion/Plantar flexion intact Incision: dressing C/D/I   Lab Results  Component Value Date   WBC 10.8 (H) 01/27/2020   HGB 13.1 01/27/2020   HCT 38.8 (L) 01/27/2020   MCV 93.5 01/27/2020   PLT 195 01/27/2020   BMET    Component Value Date/Time   NA 139 01/27/2020 0359   K 4.4 01/27/2020 0359   CL 108 01/27/2020 0359   CO2 21 (L) 01/27/2020 0359   GLUCOSE 118 (H) 01/27/2020 0359   BUN 23 01/27/2020 0359   CREATININE 1.44 (H) 01/27/2020 0359   CALCIUM 8.7 (L) 01/27/2020 0359   GFRNONAA 49 (L) 01/27/2020 0359     Assessment/Plan: 1 Day Post-Op   Active Problems:   Failed total hip arthroplasty ([place])   WBAT with walker DVT ppx: Aspirin, SCDs, TEDS PO pain control PT/OT Dispo: D/C home     [name] 01/27/2020, 1:34 PM  [facility] now [employer] [facility] [address], [place], [place] [address] Phone: [phone] [email or web address] Facebook  [employer]

## 2020-02-07 DIAGNOSIS — Z471 Aftercare following joint replacement surgery: Secondary | ICD-10-CM | POA: Diagnosis not present

## 2020-02-07 DIAGNOSIS — Z96642 Presence of left artificial hip joint: Secondary | ICD-10-CM | POA: Diagnosis not present

## 2020-03-13 DIAGNOSIS — Z96642 Presence of left artificial hip joint: Secondary | ICD-10-CM | POA: Diagnosis not present

## 2020-03-13 DIAGNOSIS — Z471 Aftercare following joint replacement surgery: Secondary | ICD-10-CM | POA: Diagnosis not present

## 2020-03-15 DIAGNOSIS — I10 Essential (primary) hypertension: Secondary | ICD-10-CM | POA: Diagnosis not present

## 2020-03-15 DIAGNOSIS — N5089 Other specified disorders of the male genital organs: Secondary | ICD-10-CM | POA: Diagnosis not present

## 2020-03-15 DIAGNOSIS — N3000 Acute cystitis without hematuria: Secondary | ICD-10-CM | POA: Diagnosis not present

## 2020-03-15 DIAGNOSIS — L308 Other specified dermatitis: Secondary | ICD-10-CM | POA: Diagnosis not present

## 2020-03-15 DIAGNOSIS — B49 Unspecified mycosis: Secondary | ICD-10-CM | POA: Diagnosis not present

## 2020-03-19 DIAGNOSIS — N5089 Other specified disorders of the male genital organs: Secondary | ICD-10-CM | POA: Diagnosis not present

## 2020-03-19 DIAGNOSIS — N503 Cyst of epididymis: Secondary | ICD-10-CM | POA: Diagnosis not present

## 2020-03-19 DIAGNOSIS — N433 Hydrocele, unspecified: Secondary | ICD-10-CM | POA: Diagnosis not present

## 2020-04-17 DIAGNOSIS — Z803 Family history of malignant neoplasm of breast: Secondary | ICD-10-CM | POA: Diagnosis not present

## 2020-04-17 DIAGNOSIS — N433 Hydrocele, unspecified: Secondary | ICD-10-CM | POA: Diagnosis not present

## 2020-04-17 DIAGNOSIS — Z79899 Other long term (current) drug therapy: Secondary | ICD-10-CM | POA: Diagnosis not present

## 2020-04-17 DIAGNOSIS — Z7982 Long term (current) use of aspirin: Secondary | ICD-10-CM | POA: Diagnosis not present

## 2020-04-17 DIAGNOSIS — Z0001 Encounter for general adult medical examination with abnormal findings: Secondary | ICD-10-CM | POA: Diagnosis not present

## 2020-04-17 DIAGNOSIS — I10 Essential (primary) hypertension: Secondary | ICD-10-CM | POA: Diagnosis not present

## 2020-04-17 DIAGNOSIS — Z8249 Family history of ischemic heart disease and other diseases of the circulatory system: Secondary | ICD-10-CM | POA: Diagnosis not present

## 2020-04-17 DIAGNOSIS — N401 Enlarged prostate with lower urinary tract symptoms: Secondary | ICD-10-CM | POA: Diagnosis not present

## 2020-04-17 DIAGNOSIS — Z8052 Family history of malignant neoplasm of bladder: Secondary | ICD-10-CM | POA: Diagnosis not present

## 2020-04-17 DIAGNOSIS — Z0000 Encounter for general adult medical examination without abnormal findings: Secondary | ICD-10-CM | POA: Diagnosis not present

## 2020-04-17 DIAGNOSIS — R3911 Hesitancy of micturition: Secondary | ICD-10-CM | POA: Diagnosis not present

## 2020-04-17 DIAGNOSIS — Z801 Family history of malignant neoplasm of trachea, bronchus and lung: Secondary | ICD-10-CM | POA: Diagnosis not present

## 2020-04-17 DIAGNOSIS — E785 Hyperlipidemia, unspecified: Secondary | ICD-10-CM | POA: Diagnosis not present

## 2020-04-17 DIAGNOSIS — Z974 Presence of external hearing-aid: Secondary | ICD-10-CM | POA: Diagnosis not present

## 2020-04-17 DIAGNOSIS — E7800 Pure hypercholesterolemia, unspecified: Secondary | ICD-10-CM | POA: Diagnosis not present

## 2020-05-04 DIAGNOSIS — H25013 Cortical age-related cataract, bilateral: Secondary | ICD-10-CM | POA: Diagnosis not present

## 2020-05-04 DIAGNOSIS — H2513 Age-related nuclear cataract, bilateral: Secondary | ICD-10-CM | POA: Diagnosis not present

## 2020-05-04 DIAGNOSIS — H353131 Nonexudative age-related macular degeneration, bilateral, early dry stage: Secondary | ICD-10-CM | POA: Diagnosis not present

## 2020-05-04 DIAGNOSIS — H524 Presbyopia: Secondary | ICD-10-CM | POA: Diagnosis not present

## 2020-05-04 DIAGNOSIS — H35361 Drusen (degenerative) of macula, right eye: Secondary | ICD-10-CM | POA: Diagnosis not present

## 2020-05-04 DIAGNOSIS — H35033 Hypertensive retinopathy, bilateral: Secondary | ICD-10-CM | POA: Diagnosis not present

## 2020-05-04 DIAGNOSIS — H02052 Trichiasis without entropian right lower eyelid: Secondary | ICD-10-CM | POA: Diagnosis not present

## 2020-05-04 DIAGNOSIS — H52203 Unspecified astigmatism, bilateral: Secondary | ICD-10-CM | POA: Diagnosis not present

## 2020-05-04 DIAGNOSIS — H5203 Hypermetropia, bilateral: Secondary | ICD-10-CM | POA: Diagnosis not present

## 2020-06-11 DIAGNOSIS — R339 Retention of urine, unspecified: Secondary | ICD-10-CM | POA: Diagnosis not present

## 2020-06-11 DIAGNOSIS — N451 Epididymitis: Secondary | ICD-10-CM | POA: Diagnosis not present

## 2020-06-18 DIAGNOSIS — Z96642 Presence of left artificial hip joint: Secondary | ICD-10-CM | POA: Diagnosis not present

## 2020-09-10 DIAGNOSIS — N138 Other obstructive and reflux uropathy: Secondary | ICD-10-CM | POA: Diagnosis not present

## 2020-09-10 DIAGNOSIS — R3589 Other polyuria: Secondary | ICD-10-CM | POA: Diagnosis not present

## 2020-09-10 DIAGNOSIS — N451 Epididymitis: Secondary | ICD-10-CM | POA: Diagnosis not present

## 2020-09-10 DIAGNOSIS — N401 Enlarged prostate with lower urinary tract symptoms: Secondary | ICD-10-CM | POA: Diagnosis not present

## 2020-09-17 DIAGNOSIS — N289 Disorder of kidney and ureter, unspecified: Secondary | ICD-10-CM | POA: Diagnosis not present

## 2020-09-17 DIAGNOSIS — N281 Cyst of kidney, acquired: Secondary | ICD-10-CM | POA: Diagnosis not present

## 2020-10-25 DIAGNOSIS — C4490 Unspecified malignant neoplasm of skin, unspecified: Secondary | ICD-10-CM | POA: Diagnosis not present

## 2020-10-25 DIAGNOSIS — N401 Enlarged prostate with lower urinary tract symptoms: Secondary | ICD-10-CM | POA: Diagnosis not present

## 2020-10-25 DIAGNOSIS — Z23 Encounter for immunization: Secondary | ICD-10-CM | POA: Diagnosis not present

## 2020-10-25 DIAGNOSIS — L989 Disorder of the skin and subcutaneous tissue, unspecified: Secondary | ICD-10-CM | POA: Diagnosis not present

## 2020-10-25 DIAGNOSIS — E7800 Pure hypercholesterolemia, unspecified: Secondary | ICD-10-CM | POA: Diagnosis not present

## 2020-10-25 DIAGNOSIS — I10 Essential (primary) hypertension: Secondary | ICD-10-CM | POA: Diagnosis not present

## 2020-10-25 DIAGNOSIS — R3911 Hesitancy of micturition: Secondary | ICD-10-CM | POA: Diagnosis not present

## 2020-10-25 DIAGNOSIS — Z79899 Other long term (current) drug therapy: Secondary | ICD-10-CM | POA: Diagnosis not present

## 2020-10-30 DIAGNOSIS — L570 Actinic keratosis: Secondary | ICD-10-CM | POA: Diagnosis not present

## 2020-10-30 DIAGNOSIS — L820 Inflamed seborrheic keratosis: Secondary | ICD-10-CM | POA: Diagnosis not present

## 2020-10-30 DIAGNOSIS — D485 Neoplasm of uncertain behavior of skin: Secondary | ICD-10-CM | POA: Diagnosis not present

## 2020-11-05 DIAGNOSIS — H25013 Cortical age-related cataract, bilateral: Secondary | ICD-10-CM | POA: Diagnosis not present

## 2020-11-05 DIAGNOSIS — H52203 Unspecified astigmatism, bilateral: Secondary | ICD-10-CM | POA: Diagnosis not present

## 2020-11-05 DIAGNOSIS — H524 Presbyopia: Secondary | ICD-10-CM | POA: Diagnosis not present

## 2020-11-05 DIAGNOSIS — H353131 Nonexudative age-related macular degeneration, bilateral, early dry stage: Secondary | ICD-10-CM | POA: Diagnosis not present

## 2020-11-05 DIAGNOSIS — H35363 Drusen (degenerative) of macula, bilateral: Secondary | ICD-10-CM | POA: Diagnosis not present

## 2020-11-05 DIAGNOSIS — H5203 Hypermetropia, bilateral: Secondary | ICD-10-CM | POA: Diagnosis not present

## 2020-11-05 DIAGNOSIS — H2513 Age-related nuclear cataract, bilateral: Secondary | ICD-10-CM | POA: Diagnosis not present

## 2020-11-21 DIAGNOSIS — C44321 Squamous cell carcinoma of skin of nose: Secondary | ICD-10-CM | POA: Diagnosis not present

## 2020-12-10 DIAGNOSIS — N289 Disorder of kidney and ureter, unspecified: Secondary | ICD-10-CM | POA: Diagnosis not present

## 2021-01-17 DIAGNOSIS — N433 Hydrocele, unspecified: Secondary | ICD-10-CM | POA: Diagnosis not present

## 2021-01-17 DIAGNOSIS — N1831 Chronic kidney disease, stage 3a: Secondary | ICD-10-CM | POA: Diagnosis not present

## 2021-01-17 DIAGNOSIS — N281 Cyst of kidney, acquired: Secondary | ICD-10-CM | POA: Diagnosis not present

## 2021-01-17 DIAGNOSIS — E669 Obesity, unspecified: Secondary | ICD-10-CM | POA: Diagnosis not present

## 2021-01-17 DIAGNOSIS — N400 Enlarged prostate without lower urinary tract symptoms: Secondary | ICD-10-CM | POA: Diagnosis not present

## 2021-01-17 DIAGNOSIS — I129 Hypertensive chronic kidney disease with stage 1 through stage 4 chronic kidney disease, or unspecified chronic kidney disease: Secondary | ICD-10-CM | POA: Diagnosis not present

## 2021-01-17 DIAGNOSIS — M1000 Idiopathic gout, unspecified site: Secondary | ICD-10-CM | POA: Diagnosis not present

## 2021-05-01 ENCOUNTER — Encounter (HOSPITAL_COMMUNITY): Payer: Self-pay

## 2021-05-01 ENCOUNTER — Emergency Department (HOSPITAL_COMMUNITY): Payer: No Typology Code available for payment source

## 2021-05-01 ENCOUNTER — Other Ambulatory Visit: Payer: Self-pay

## 2021-05-01 ENCOUNTER — Emergency Department (HOSPITAL_COMMUNITY)
Admission: EM | Admit: 2021-05-01 | Discharge: 2021-05-01 | Disposition: A | Discharge status: Home / Self Care | Payer: No Typology Code available for payment source | Attending: Emergency Medicine | Admitting: Emergency Medicine

## 2021-05-01 DIAGNOSIS — R42 Dizziness and giddiness: Secondary | ICD-10-CM | POA: Insufficient documentation

## 2021-05-01 DIAGNOSIS — S4992XA Unspecified injury of left shoulder and upper arm, initial encounter: Secondary | ICD-10-CM | POA: Diagnosis present

## 2021-05-01 DIAGNOSIS — S43005A Unspecified dislocation of left shoulder joint, initial encounter: Secondary | ICD-10-CM | POA: Insufficient documentation

## 2021-05-01 DIAGNOSIS — W19XXXA Unspecified fall, initial encounter: Secondary | ICD-10-CM | POA: Insufficient documentation

## 2021-05-01 DIAGNOSIS — Y93K1 Activity, walking an animal: Secondary | ICD-10-CM | POA: Diagnosis not present

## 2021-05-01 LAB — CBG MONITORING, ED: Glucose-Capillary: 219 mg/dL — ABNORMAL HIGH (ref 70–99)

## 2021-05-01 MED ORDER — FENTANYL CITRATE PF 50 MCG/ML IJ SOSY
100.0000 ug | PREFILLED_SYRINGE | Freq: Once | INTRAMUSCULAR | Status: AC
  Administered 2021-05-01: 100 ug via INTRAVENOUS
  Filled 2021-05-01: qty 2

## 2021-05-01 MED ORDER — DICLOFENAC SODIUM 1 % EX GEL: 4.0000 g | Freq: Four times a day (QID) | CUTANEOUS | 0 refills | Status: DC

## 2021-05-01 MED ORDER — SODIUM CHLORIDE 0.9 % IV BOLUS
1000.0000 mL | Freq: Once | INTRAVENOUS | Status: AC
  Administered 2021-05-01: 1000 mL via INTRAVENOUS

## 2021-05-01 MED ORDER — PROPOFOL 10 MG/ML IV BOLUS
0.5000 mg/kg | Freq: Once | INTRAVENOUS | Status: AC
Start: 2021-05-01 — End: 2021-05-01
  Administered 2021-05-01: 48.1 mg via INTRAVENOUS
  Filled 2021-05-01: qty 20

## 2021-05-01 MED ORDER — LIDOCAINE-EPINEPHRINE 2 %-1:200000 IJ SOLN
10.0000 mL | Freq: Once | INTRAMUSCULAR | Status: AC
Start: 2021-05-01 — End: 2021-05-01
  Administered 2021-05-01: 10 mL via INTRADERMAL
  Filled 2021-05-01: qty 20

## 2021-05-01 NOTE — Discharge Instructions (Addendum)
Use the gel as prescribed. ?Also take tylenol '1000mg'$ (2 extra strength) four times a day.  ?  ?Follow up with ortho in the office.   ? ?Take your arm out of the sling, make small circles with your shoulder.  Do not try and raise your arm out to the side until directed by ortho.  ? ?

## 2021-05-01 NOTE — ED Notes (Signed)
Conscious Sedation ?Time Out Preformed by Dr. Tyrone Nine ?VS Q 3 minutes, ETCO2 set up ?Suction set up ?Ambu bag set up ?Cardiac monitor in place ? ?Staff Present ?Bonney Roussel, RN ?Sharrie Rothman, RN ?Jenny Reichmann, OT ?Dr. Tyrone Nine ? ?1442 40 mg propofol pushed by Bonney Roussel, [place].  Pt remains awake alert respirations even and unlabored  ?1443 Pt grimacing w/ manipulation of verbal order from Dr. Tyrone Nine for an additional 10 mg of propofol, ?1444 10 mg propofol given by Bonney Roussel, RN, pt remains tense, verbal order for additional 10 mg propofol, propofol given by Bonney Roussel, RN. ?1445 Sedation achieved ?1446 Oxygen turned up as pt's O2 dropped to 88% ?1447 Reduction achieved, x-ray en route to confirm. ?6606 Pt awake alert and reporting shoulder feeling better.  ?

## 2021-05-01 NOTE — ED Provider Notes (Signed)
?Center DEPT ?Provider Note ? ? ?CSN: [id number] ?Arrival date & time: 05/01/21  1228 ? ?  ? ?History ? ?Chief Complaint  ?Patient presents with  ? Fall  ? Dizziness  ? ? ?Jay Washington is a 81 y.o. male. ? ?81 yo M with a cc of left shoulder pain.  Had a fall.  Felt dizzy and then fell.  Dizziness has improved.  Denies head injury, denies neck pain back pain chest pain or abdominal pain.  Denies pain at the elbow.  Went to use orthopedic urgent care was diagnosed with a dislocated shoulder.  Sent to the ED for evaluation. ? ? ?Fall ? ?Dizziness ? ?  ? ?Home Medications ?Prior to Admission medications   ?Medication Sig Start Date End Date Taking? Authorizing Provider  ?diclofenac Sodium (VOLTAREN) 1 % GEL Apply 4 g topically 4 (four) times daily. 05/01/21  Yes Jay Etienne, DO  ?allopurinol (ZYLOPRIM) 300 MG tablet Take 300 mg by mouth daily.    [provider]  ?amLODipine (NORVASC) 5 MG tablet Take 5 mg by mouth daily.    [provider]  ?cholecalciferol (VITAMIN D3) 25 MCG (1000 UNIT) tablet Take 1,000 Units by mouth daily.    [provider]  ?clotrimazole-betamethasone (LOTRISONE) cream Apply 1 application topically 2 (two) times daily as needed (irritation).  10/19/19   [provider]  ?docusate sodium (COLACE) 100 MG capsule Take 1 capsule (100 mg total) by mouth 2 (two) times daily. 01/27/20   [name], PA-C  ?finasteride (PROSCAR) 5 MG tablet Take 5 mg by mouth daily.    [provider]  ?HYDROcodone-acetaminophen (NORCO/VICODIN) 5-325 MG tablet Take 1-2 tablets by mouth every 4 (four) hours as needed for moderate pain (pain score 4-6). 01/27/20   [name], PA-C  ?levothyroxine (SYNTHROID) 25 MCG tablet Take 25 mcg by mouth daily before breakfast.    [provider]  ?losartan (COZAAR) 100 MG tablet Take 100 mg by mouth daily.    [provider]  ?ondansetron (ZOFRAN) 4 MG tablet Take 1 tablet  (4 mg total) by mouth every 6 (six) hours as needed for nausea. 01/27/20   [name], PA-C  ?pravastatin (PRAVACHOL) 40 MG tablet Take 40 mg by mouth daily.    [provider]  ?senna (SENOKOT) 8.6 MG TABS tablet Take 1 tablet (8.6 mg total) by mouth 2 (two) times daily. 01/27/20   [name], PA-C  ?tamsulosin (FLOMAX) 0.4 MG CAPS capsule Take 0.4 mg by mouth at bedtime.    [provider]  ?   ? ?Allergies    ?Patient has no known allergies.   ? ?Review of Systems   ?Review of Systems  ?Neurological:  Positive for dizziness.  ? ?Physical Exam ?Updated Vital Signs ?BP 133/65   Pulse 82   Temp 97.6 ?F (36.4 ?C) (Oral)   Resp 18   Wt 96.2 kg   SpO2 92%   BMI 31.31 kg/m?  ?Physical Exam ?Vitals and nursing note reviewed.  ?Constitutional:   ?   Appearance: He is well-developed.  ?HENT:  ?   Head: Normocephalic and atraumatic.  ?Eyes:  ?   Pupils: Pupils are equal, round, and reactive to light.  ?Neck:  ?   Vascular: No JVD.  ?Cardiovascular:  ?   Rate and Rhythm: Normal rate and regular rhythm.  ?   Heart sounds: No murmur heard. ?  No friction rub. No gallop.  ?Pulmonary:  ?  Effort: No respiratory distress.  ?   Breath sounds: No wheezing.  ?Abdominal:  ?   General: There is no distension.  ?   Tenderness: There is no abdominal tenderness. There is no guarding or rebound.  ?Musculoskeletal:     ?   General: Deformity present. Normal range of motion.  ?   Cervical back: Normal range of motion and neck supple.  ?   Comments: Obvious deformity of the left shoulder.  Pulse motor and sensation intact distally.  No pain at the elbow.  Palpated from head to toe without obvious other noted areas of pain.  ?Skin: ?   Coloration: Skin is not pale.  ?   Findings: No rash.  ?Neurological:  ?   Mental Status: He is alert and oriented to person, place, and time.  ?Psychiatric:     ?   Behavior: Behavior normal.  ? ? ?ED Results / Procedures / Treatments   ?Labs ?(all labs ordered are  listed, but only abnormal results are displayed) ?Labs Reviewed  ?CBG MONITORING, ED - Abnormal; Notable for the following components:  ?    Result Value  ? Glucose-Capillary 219 (*)   ? All other components within normal limits  ? ? ?EKG ?EKG Interpretation ? ?Date/Time:  Wednesday May 01 2021 13:44:50 EDT ?Ventricular Rate:  69 ?PR Interval:  226 ?QRS Duration: 94 ?QT Interval:  387 ?QTC Calculation: 415 ?R Axis:   -16 ?Text Interpretation: Sinus rhythm Atrial premature complex Prolonged PR interval Borderline left axis deviation Low voltage, precordial leads Consider anterior infarct Nonspecific T abnormalities, lateral leads No old tracing to compare Confirmed by Jay Washington [id number]) on 05/01/2021 3:04:45 PM ? ?Radiology ?DG Shoulder Left Port ? ?Result Date: 05/01/2021 ?CLINICAL DATA:  Left shoulder pain, fall, initial encounter. EXAM: LEFT SHOULDER COMPARISON:  None. FINDINGS: Anterior dislocation of the humeral head with respect to the glenoid. No definite associated fracture. Visualized left chest is unremarkable. IMPRESSION: Anterior left shoulder dislocation. Electronically Signed   By: Lorin Picket M.D.   On: 05/01/2021 13:41   ? ?Procedures ?Reduction of dislocation ? ?Date/Time: 05/01/2021 3:16 PM ?Performed by: Jay Etienne, DO ?Authorized by: Jay Etienne, DO  ?Consent: Verbal consent obtained. ?Risks and benefits: risks, benefits and alternatives were discussed ?Consent given by: patient ?Patient understanding: patient states understanding of the procedure being performed ?Patient consent: the patient's understanding of the procedure matches consent given ?Imaging studies: imaging studies available ?Required items: required blood products, implants, devices, and special equipment available ?Patient identity confirmed: verbally with patient ?Time out: Immediately prior to procedure a "time out" was called to verify the correct patient, procedure, equipment, support staff and site/side marked as  required. ?Preparation: Patient was prepped and draped in the usual sterile fashion. ?Local anesthesia used: yes ?Anesthesia: local infiltration ? ?Anesthesia: ?Local anesthesia used: yes ?Local Anesthetic: lidocaine 2% with epinephrine ?Anesthetic total: 20 mL ? ?Sedation: ?Patient sedated: yes ?Sedation type: moderate (conscious) sedation ?Sedatives: propofol ?Analgesia: fentanyl ? ?Patient tolerance: patient tolerated the procedure well with no immediate complications ? ? ?Marland KitchenSedation ? ?Date/Time: 05/01/2021 3:17 PM ?Performed by: Jay Etienne, DO ?Authorized by: Jay Etienne, DO  ? ?Consent:  ?  Consent obtained:  Verbal ?  Consent given by:  Patient ?  Alternatives discussed:  Analgesia without sedation and anxiolysis ?Universal protocol:  ?  Immediately prior to procedure, a time out was called: yes   ?  Patient identity confirmed:  Arm band ?Indications:  ?  Procedure performed:  Dislocation  reduction ?  Procedure necessitating sedation performed by:  Physician performing sedation ?Pre-sedation assessment:  ?  Time since last food or drink:  6 ?  ASA classification: class 2 - patient with mild systemic disease   ?  Mouth opening:  2 finger widths ?  Thyromental distance:  3 finger widths ?  Mallampati score:  II - soft palate, uvula, fauces visible ?  Neck mobility: normal   ?  Pre-sedation assessments completed and reviewed: airway patency, cardiovascular function, hydration status, mental status, nausea/vomiting, pain level, respiratory function and temperature   ?Immediate pre-procedure details:  ?  Reviewed: vital signs, relevant labs/tests and NPO status   ?  Verified: bag valve mask available, emergency equipment available, intubation equipment available, IV patency confirmed, oxygen available and suction available   ?Procedure details (see MAR for exact dosages):  ?  Preoxygenation:  Nasal cannula ?  Sedation:  Propofol ?  Intended level of sedation: moderate (conscious sedation) ?  Analgesia:  Fentanyl ?   Intra-procedure monitoring:  Blood pressure monitoring, cardiac monitor, continuous capnometry, continuous pulse oximetry, frequent LOC assessments and frequent vital sign checks ?  Intra-procedure events: hypoxia and respir

## 2021-05-01 NOTE — ED Triage Notes (Addendum)
Pt reports feeling dizzy when walking his dog this morning. Pt reports falling and injuring his left arm. Pt went to orthopedic prior to here and was told his shoulder is dislocated and was sent here. Pt feels faint and is actively vomiting in triage.  ?

## 2021-12-19 IMAGING — RF DG C-ARM 1-60 MIN-NO REPORT
1 series · 2 of 2 positions shown · non-contrast
Comparison: X-ray pelvis 12/30/2019.

CLINICAL DATA: Surgery left hip

EXAM:
OPERATIVE left HIP (WITH PELVIS IF PERFORMED)  VIEWS
TECHNIQUE: Fluoroscopic spot image(s) were submitted for interpretation
post-operatively.

[Series 1: unknown protocol · 0.20mm/px · 2 of 2 slices shown]
[im 1/2]
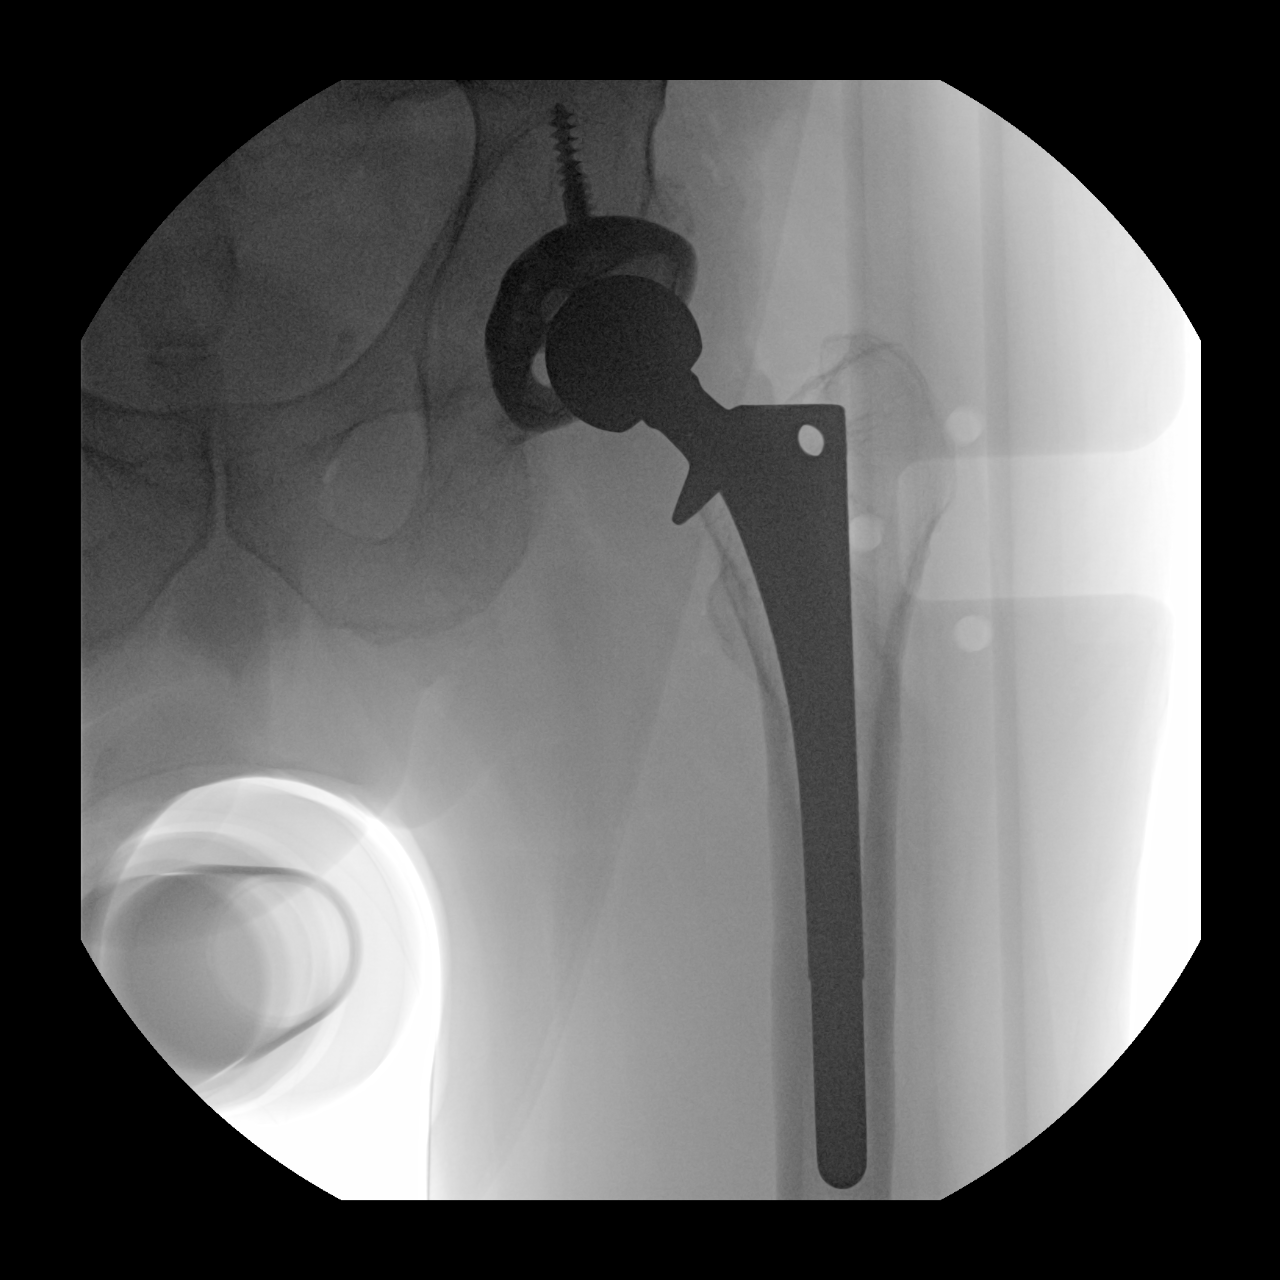
[im 2/2]
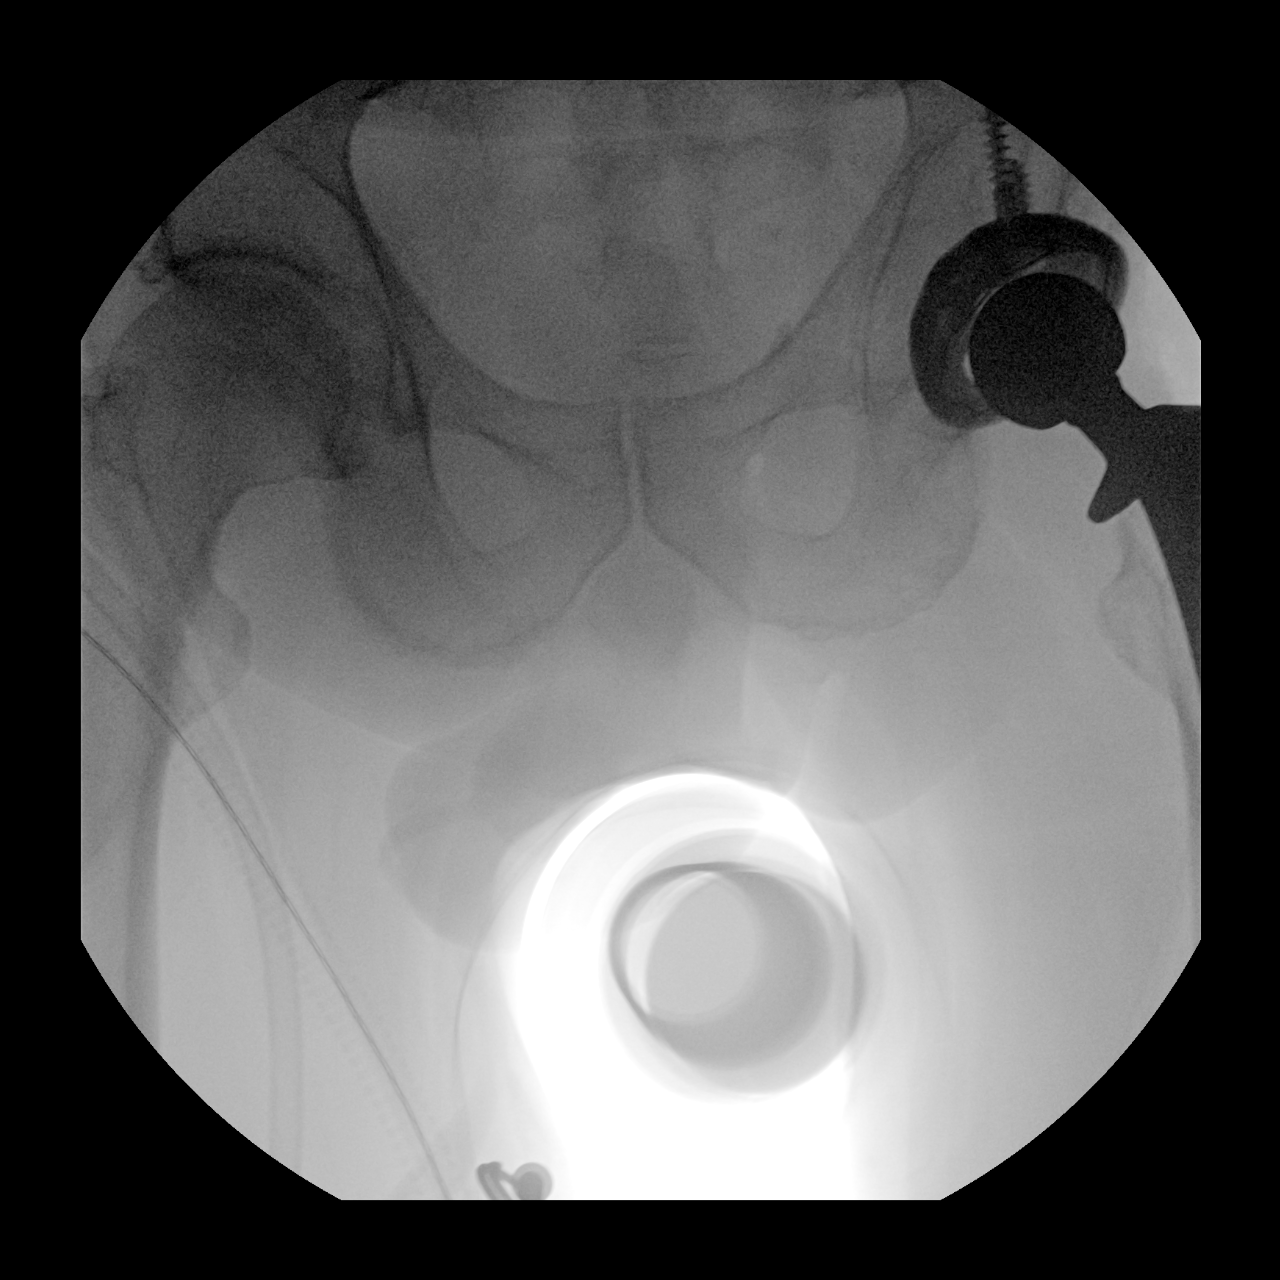

[2 of 2 positions shown; findings below may reference images not displayed]

FINDINGS: Intraoperative left total hip arthroplasty revision.

Two low resolution intraoperative spot views of the left hip were
obtained. No fracture visible on the limited views.

Total fluoroscopy time: 6 seconds

Total radiation dose: 1.03 mGy
IMPRESSION: Intraoperative left total hip arthroplasty revision.

## 2021-12-19 IMAGING — RF DG HIP (WITH PELVIS) OPERATIVE*L*
1 series · 2 of 2 positions shown · non-contrast
Comparison: X-ray pelvis 12/30/2019.

CLINICAL DATA: Surgery left hip

EXAM:
OPERATIVE left HIP (WITH PELVIS IF PERFORMED)  VIEWS
TECHNIQUE: Fluoroscopic spot image(s) were submitted for interpretation
post-operatively.

[Series 1: unknown protocol · 0.20mm/px · 2 of 2 slices shown]
[im 1/2]
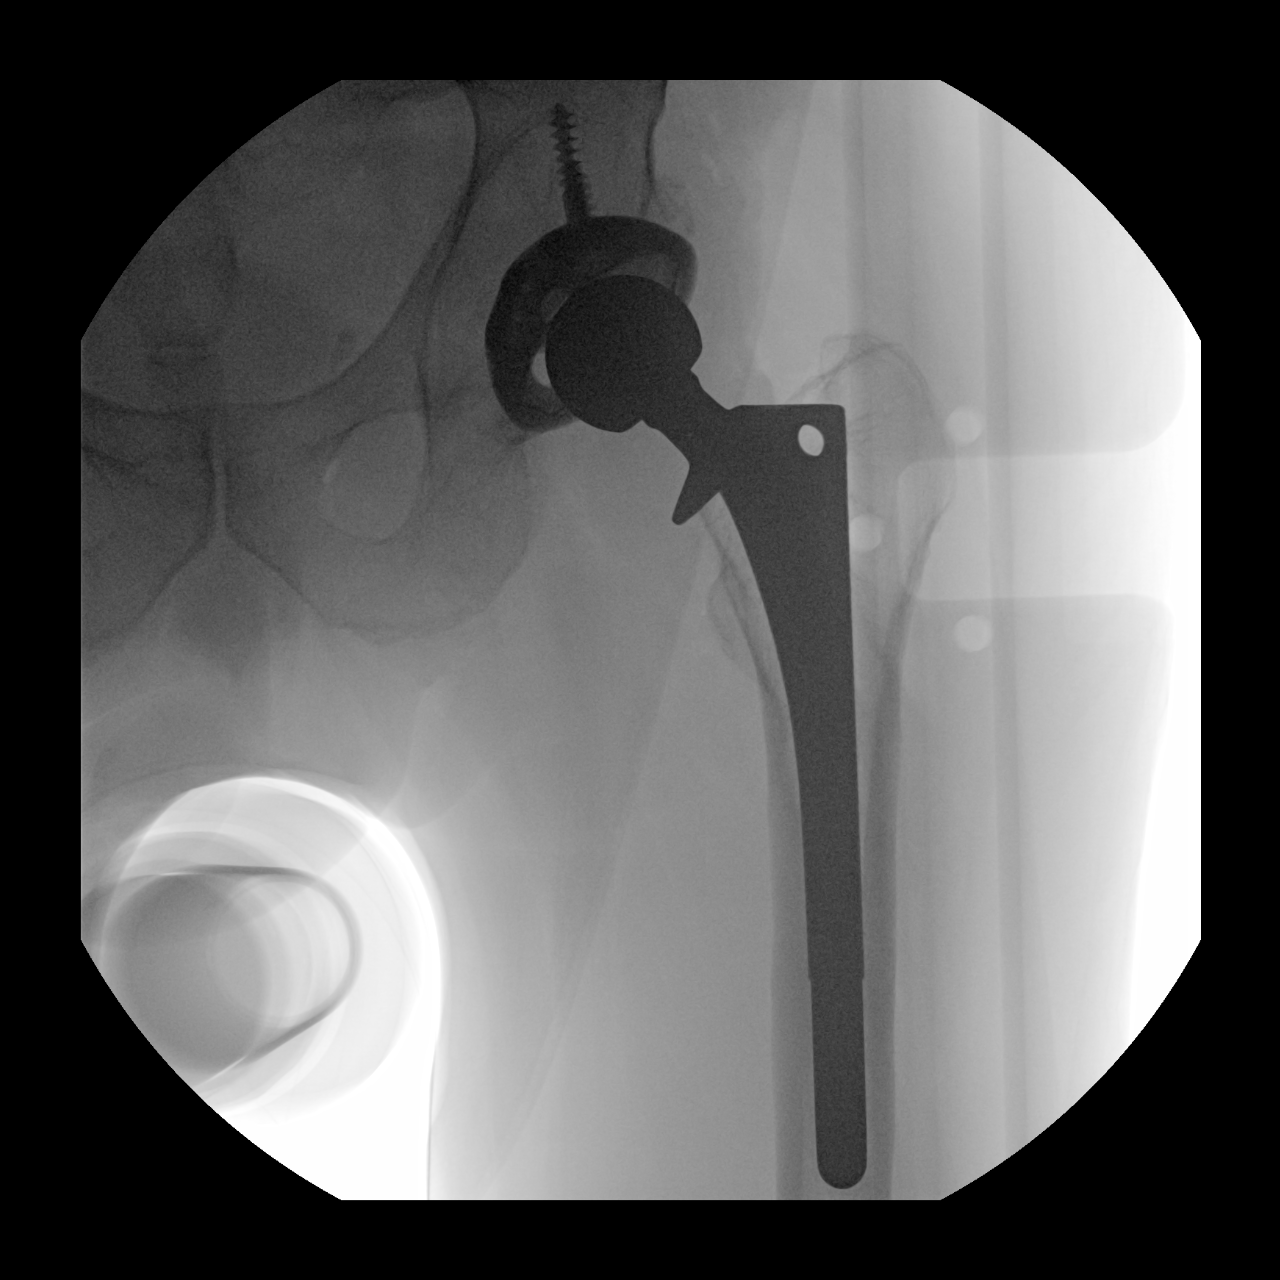
[im 2/2]
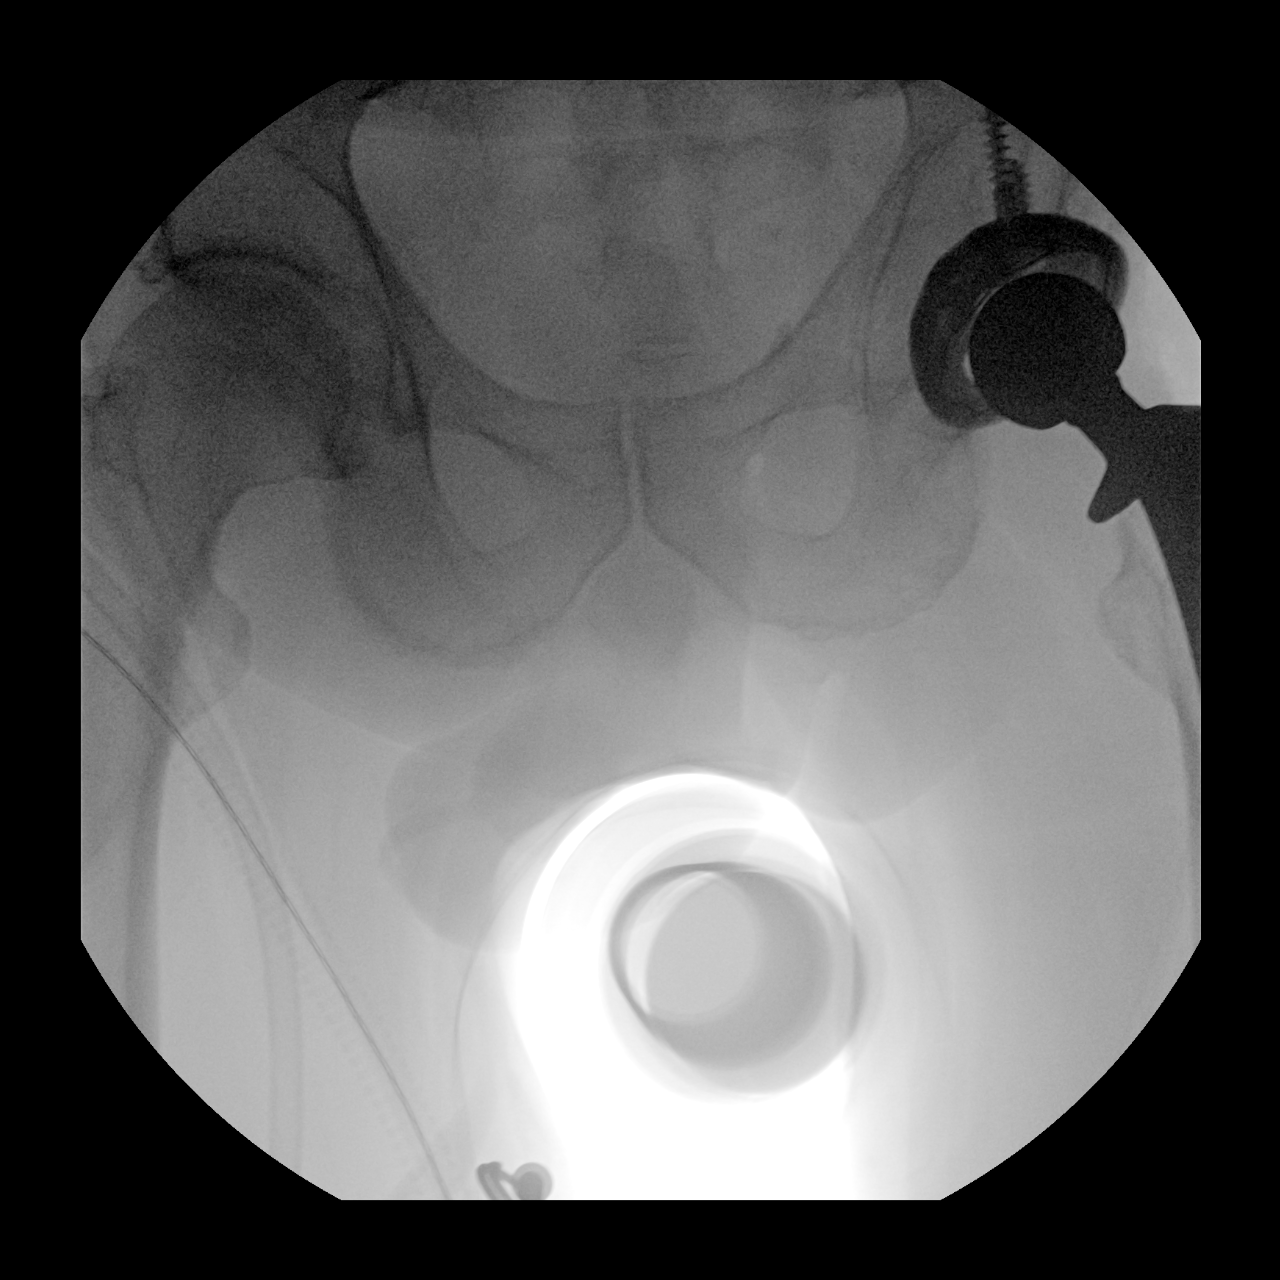

[2 of 2 positions shown; findings below may reference images not displayed]

FINDINGS: Intraoperative left total hip arthroplasty revision.

Two low resolution intraoperative spot views of the left hip were
obtained. No fracture visible on the limited views.

Total fluoroscopy time: 6 seconds

Total radiation dose: 1.03 mGy
IMPRESSION: Intraoperative left total hip arthroplasty revision.

## 2023-06-12 ENCOUNTER — Encounter (HOSPITAL_COMMUNITY): Payer: Self-pay | Admitting: Emergency Medicine

## 2023-06-12 ENCOUNTER — Other Ambulatory Visit: Payer: Self-pay

## 2023-06-12 ENCOUNTER — Inpatient Hospital Stay (HOSPITAL_COMMUNITY)
Admission: EM | Admit: 2023-06-12 | Discharge: 2023-06-15 | DRG: 534 | Disposition: A | Discharge status: Home Health | Attending: Internal Medicine | Admitting: Internal Medicine

## 2023-06-12 ENCOUNTER — Emergency Department (HOSPITAL_COMMUNITY)

## 2023-06-12 DIAGNOSIS — K219 Gastro-esophageal reflux disease without esophagitis: Secondary | ICD-10-CM | POA: Diagnosis present

## 2023-06-12 DIAGNOSIS — I10 Essential (primary) hypertension: Secondary | ICD-10-CM

## 2023-06-12 DIAGNOSIS — M109 Gout, unspecified: Secondary | ICD-10-CM | POA: Diagnosis present

## 2023-06-12 DIAGNOSIS — M978XXA Periprosthetic fracture around other internal prosthetic joint, initial encounter: Principal | ICD-10-CM

## 2023-06-12 DIAGNOSIS — M9702XA Periprosthetic fracture around internal prosthetic left hip joint, initial encounter: Secondary | ICD-10-CM | POA: Diagnosis present

## 2023-06-12 DIAGNOSIS — E785 Hyperlipidemia, unspecified: Secondary | ICD-10-CM | POA: Diagnosis present

## 2023-06-12 DIAGNOSIS — I440 Atrioventricular block, first degree: Secondary | ICD-10-CM | POA: Diagnosis present

## 2023-06-12 DIAGNOSIS — E039 Hypothyroidism, unspecified: Secondary | ICD-10-CM | POA: Diagnosis present

## 2023-06-12 DIAGNOSIS — N183 Chronic kidney disease, stage 3 unspecified: Secondary | ICD-10-CM

## 2023-06-12 DIAGNOSIS — S7292XA Unspecified fracture of left femur, initial encounter for closed fracture: Principal | ICD-10-CM | POA: Diagnosis present

## 2023-06-12 DIAGNOSIS — R7303 Prediabetes: Secondary | ICD-10-CM | POA: Diagnosis present

## 2023-06-12 DIAGNOSIS — N1831 Chronic kidney disease, stage 3a: Secondary | ICD-10-CM | POA: Diagnosis present

## 2023-06-12 DIAGNOSIS — Y92015 Private garage of single-family (private) house as the place of occurrence of the external cause: Secondary | ICD-10-CM

## 2023-06-12 DIAGNOSIS — Z79899 Other long term (current) drug therapy: Secondary | ICD-10-CM

## 2023-06-12 DIAGNOSIS — W010XXA Fall on same level from slipping, tripping and stumbling without subsequent striking against object, initial encounter: Secondary | ICD-10-CM | POA: Diagnosis present

## 2023-06-12 DIAGNOSIS — R001 Bradycardia, unspecified: Secondary | ICD-10-CM | POA: Diagnosis present

## 2023-06-12 DIAGNOSIS — N179 Acute kidney failure, unspecified: Secondary | ICD-10-CM | POA: Diagnosis present

## 2023-06-12 DIAGNOSIS — Z79890 Hormone replacement therapy (postmenopausal): Secondary | ICD-10-CM

## 2023-06-12 DIAGNOSIS — N400 Enlarged prostate without lower urinary tract symptoms: Secondary | ICD-10-CM | POA: Diagnosis present

## 2023-06-12 DIAGNOSIS — Z66 Do not resuscitate: Secondary | ICD-10-CM | POA: Diagnosis present

## 2023-06-12 DIAGNOSIS — Z7982 Long term (current) use of aspirin: Secondary | ICD-10-CM

## 2023-06-12 DIAGNOSIS — I129 Hypertensive chronic kidney disease with stage 1 through stage 4 chronic kidney disease, or unspecified chronic kidney disease: Secondary | ICD-10-CM | POA: Diagnosis present

## 2023-06-12 DIAGNOSIS — Z96649 Presence of unspecified artificial hip joint: Principal | ICD-10-CM

## 2023-06-12 LAB — BASIC METABOLIC PANEL WITH GFR
Anion gap: 9 (ref 5–15)
BUN: 28 mg/dL — ABNORMAL HIGH (ref 8–23)
CO2: 23 mmol/L (ref 22–32)
Calcium: 8.9 mg/dL (ref 8.9–10.3)
Chloride: 105 mmol/L (ref 98–111)
Creatinine, Ser: 1.25 mg/dL — ABNORMAL HIGH (ref 0.61–1.24)
GFR, Estimated: 57 mL/min — ABNORMAL LOW (ref >60)
Glucose, Bld: 118 mg/dL — ABNORMAL HIGH (ref 70–99)
Potassium: 4.1 mmol/L (ref 3.5–5.1)
Sodium: 137 mmol/L (ref 135–145)

## 2023-06-12 LAB — CBC WITH DIFFERENTIAL/PLATELET
Abs Immature Granulocytes: 0.05 10*3/uL (ref 0.00–0.07)
Basophils Absolute: 0 10*3/uL (ref 0.0–0.1)
Basophils Relative: 0 %
Eosinophils Absolute: 0.1 10*3/uL (ref 0.0–0.5)
Eosinophils Relative: 2 %
HCT: 41.6 % (ref 39.0–52.0)
Hemoglobin: 13.6 g/dL (ref 13.0–17.0)
Immature Granulocytes: 1 %
Lymphocytes Relative: 16 %
Lymphs Abs: 1.2 10*3/uL (ref 0.7–4.0)
MCH: 30.5 pg (ref 26.0–34.0)
MCHC: 32.7 g/dL (ref 30.0–36.0)
MCV: 93.3 fL (ref 80.0–100.0)
Monocytes Absolute: 0.5 10*3/uL (ref 0.1–1.0)
Monocytes Relative: 7 %
Neutro Abs: 5.6 10*3/uL (ref 1.7–7.7)
Neutrophils Relative %: 74 %
Platelets: 214 10*3/uL (ref 150–400)
RBC: 4.46 MIL/uL (ref 4.22–5.81)
RDW: 14 % (ref 11.5–15.5)
WBC: 7.5 10*3/uL (ref 4.0–10.5)
nRBC: 0 % (ref 0.0–0.2)

## 2023-06-12 MED ORDER — ACETAMINOPHEN 500 MG PO TABS
1000.0000 mg | ORAL_TABLET | Freq: Once | ORAL | Status: AC
  Administered 2023-06-12: 1000 mg via ORAL
  Filled 2023-06-12: qty 2

## 2023-06-12 MED ORDER — OXYCODONE HCL 5 MG PO TABS: 5.0000 mg | ORAL_TABLET | ORAL | Status: DC | PRN

## 2023-06-12 MED ORDER — OXYCODONE HCL 5 MG PO TABS
5.0000 mg | ORAL_TABLET | Freq: Once | ORAL | Status: AC
  Administered 2023-06-12: 5 mg via ORAL
  Filled 2023-06-12: qty 1

## 2023-06-12 MED ORDER — LEVOTHYROXINE SODIUM 25 MCG PO TABS
25.0000 ug | ORAL_TABLET | Freq: Every day | ORAL | Status: DC
Start: 2023-06-13 — End: 2023-06-13

## 2023-06-12 MED ORDER — AMLODIPINE BESYLATE 5 MG PO TABS
5.0000 mg | ORAL_TABLET | Freq: Every day | ORAL | Status: DC
Start: 2023-06-12 — End: 2023-06-13
  Administered 2023-06-13: 5 mg via ORAL
  Filled 2023-06-12: qty 1

## 2023-06-12 MED ORDER — PRAVASTATIN SODIUM 20 MG PO TABS: 40.0000 mg | ORAL_TABLET | Freq: Every day | ORAL | Status: DC

## 2023-06-12 MED ORDER — LOSARTAN POTASSIUM 50 MG PO TABS
100.0000 mg | ORAL_TABLET | Freq: Every day | ORAL | Status: DC
  Administered 2023-06-13: 100 mg via ORAL
  Filled 2023-06-12: qty 2

## 2023-06-12 MED ORDER — ONDANSETRON HCL 4 MG/2ML IJ SOLN
4.0000 mg | Freq: Once | INTRAMUSCULAR | Status: AC
  Administered 2023-06-12: 4 mg via INTRAVENOUS
  Filled 2023-06-12: qty 2

## 2023-06-12 MED ORDER — TAMSULOSIN HCL 0.4 MG PO CAPS
0.4000 mg | ORAL_CAPSULE | Freq: Every day | ORAL | Status: DC
  Administered 2023-06-13: 0.4 mg via ORAL
  Filled 2023-06-12: qty 1

## 2023-06-12 MED ORDER — DOCUSATE SODIUM 100 MG PO CAPS
100.0000 mg | ORAL_CAPSULE | Freq: Two times a day (BID) | ORAL | Status: DC
Start: 2023-06-12 — End: 2023-06-13

## 2023-06-12 MED ORDER — ALLOPURINOL 300 MG PO TABS
300.0000 mg | ORAL_TABLET | Freq: Every day | ORAL | Status: DC
  Administered 2023-06-13 – 2023-06-15 (×3): 300 mg via ORAL
  Filled 2023-06-12 (×4): qty 1

## 2023-06-12 MED ORDER — FINASTERIDE 5 MG PO TABS
5.0000 mg | ORAL_TABLET | Freq: Every day | ORAL | Status: DC
  Administered 2023-06-13 – 2023-06-15 (×3): 5 mg via ORAL
  Filled 2023-06-12 (×3): qty 1

## 2023-06-12 MED ORDER — VITAMIN D 25 MCG (1000 UNIT) PO TABS
1000.0000 [IU] | ORAL_TABLET | Freq: Every day | ORAL | Status: DC
  Administered 2023-06-13 – 2023-06-15 (×3): 1000 [IU] via ORAL
  Filled 2023-06-12 (×3): qty 1

## 2023-06-12 MED ORDER — ONDANSETRON HCL 4 MG PO TABS: 4.0000 mg | ORAL_TABLET | Freq: Four times a day (QID) | ORAL | Status: DC | PRN

## 2023-06-12 MED ORDER — ACETAMINOPHEN 500 MG PO TABS
1000.0000 mg | ORAL_TABLET | Freq: Three times a day (TID) | ORAL | Status: DC
  Administered 2023-06-13 – 2023-06-15 (×7): 1000 mg via ORAL
  Filled 2023-06-12 (×7): qty 2

## 2023-06-12 NOTE — ED Provider Notes (Signed)
  EMERGENCY DEPARTMENT AT [facility] Provider Note  CSN: [id number] Arrival date & time: 06/12/23 1951  Chief Complaint(s) Fall  HPI Jay Washington is a 83 y.o. male here today for a slip and fall.  Patient says that he was walking in his garage, stepped on a can it rolled and he fell over onto the left side of his hip.  He did not strike his head or lose conscious.  He does not take blood thinners.  Patient denies any pain elsewhere aside from in his left hip.  He had a total hip on that side in December 2021 with Dr. Charol Copas.  He has a history of gout, hypertension, hyperlipidemia, BPH.   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 Authorizing Provider  allopurinol  (ZYLOPRIM ) 300 MG tablet Take 300 mg by mouth daily.    [provider]  amLODipine  (NORVASC ) 5 MG tablet Take 5 mg by mouth daily.    [provider]  cholecalciferol  (VITAMIN D3) 25 MCG (1000 UNIT) tablet Take 1,000 Units by mouth daily.    [provider]  clotrimazole-betamethasone (LOTRISONE) cream Apply 1 application topically 2 (two) times daily as needed (irritation).  10/19/19   [provider]  diclofenac  Sodium (VOLTAREN ) 1 % GEL Apply 4 g topically 4 (four) times daily. 05/01/21   Albertus Hughs, DO  docusate sodium  (COLACE) 100 MG capsule Take 1 capsule (100 mg total) by mouth 2 (two) times daily. 01/27/20   Elisa Guest, PA-C  finasteride  (PROSCAR ) 5 MG tablet Take 5 mg by mouth daily.    [provider]  HYDROcodone -acetaminophen  (NORCO/VICODIN)  5-325 MG tablet Take 1-2 tablets by mouth every 4 (four) hours as needed for moderate pain (pain score 4-6). 01/27/20   Elisa Guest, PA-C  levothyroxine  (SYNTHROID ) 25 MCG tablet Take 25 mcg by mouth daily before breakfast.    [provider]  losartan (COZAAR) 100 MG tablet Take 100 mg by mouth daily.    [provider]  ondansetron  (ZOFRAN ) 4 MG tablet Take 1 tablet (4 mg total) by mouth every 6 (six) hours as needed for nausea. 01/27/20   Abelardo Hoehn B, PA-C  pravastatin  (PRAVACHOL ) 40 MG tablet Take 40 mg by mouth daily.    [provider]  senna (SENOKOT) 8.6 MG TABS tablet Take 1 tablet (8.6 mg total) by mouth 2 (two) times daily. 01/27/20   Elisa Guest, PA-C  tamsulosin  (FLOMAX ) 0.4 MG CAPS capsule Take 0.4 mg by mouth at bedtime.    [provider]  Past Surgical History Past Surgical History:  Procedure Laterality Date   APPENDECTOMY     JOINT REPLACEMENT Left    hip   TONSILLECTOMY     TOTAL HIP REVISION Left 01/26/2020   Procedure: TOTAL HIP REVISION head ball and liner exchange;  Surgeon: Adonica Hoose, MD;  Location: [facility];  Service: Orthopedics;  Laterality: Left;   Family History History reviewed. No pertinent family history.  Social History Social History   Tobacco Use   Smoking status: Never   Smokeless tobacco: Never  Vaping Use   Vaping status: Never Used  Substance Use Topics   Alcohol use: Never   Drug use: Never   Allergies Patient has no known allergies.  Review of Systems Review of Systems  Physical Exam Vital Signs  I have reviewed the triage vital signs BP (!) 151/67   Pulse 67   Temp 97.6 F (36.4 C) (Oral)   Resp 18   Ht 5\' 9"  (1.753 m)   Wt 96.2 kg   SpO2 93%   BMI 31.32 kg/m   Physical Exam Vitals reviewed.  Constitutional:      Appearance: He is not  ill-appearing.  Cardiovascular:     Rate and Rhythm: Normal rate.     Pulses: Normal pulses.  Abdominal:     General: Abdomen is flat.     Palpations: Abdomen is soft.  Musculoskeletal:     Comments: Legs even on the leg.  Patient with tenderness over the left greater trochanter.  Patient not able to lift leg in bed.  Neurological:     Mental Status: He is alert.     ED Results and Treatments Labs (all labs ordered are listed, but only abnormal results are displayed) Labs Reviewed  BASIC METABOLIC PANEL WITH GFR - Abnormal; Notable for the following components:      Result Value   Glucose, Bld 118 (*)    BUN 28 (*)    Creatinine, Ser 1.25 (*)    GFR, Estimated 57 (*)    All other components within normal limits  CBC WITH DIFFERENTIAL/PLATELET                                                                                                                          Radiology DG Hip Unilat With Pelvis 2-3 Views Left Result Date: 06/12/2023 CLINICAL DATA:  Fall, left hip pain EXAM: DG HIP (WITH OR WITHOUT PELVIS) 2-3V LEFT COMPARISON:  None Available. FINDINGS: Left hip arthroplasty in satisfactory position. However, there is a suspected periprosthetic fracture along the lateral aspect of the proximal femoral stem. Visualized bony pelvis appears intact. Degenerative changes of the lower lumbar spine. IMPRESSION: Left hip arthroplasty in satisfactory position. Suspected periprosthetic fracture along the lateral aspect of the proximal femoral stem. Electronically Signed   By: Zadie Herter M.D.   On: 06/12/2023 22:16    Pertinent labs & imaging results that were available during my care of the patient were reviewed by  me and considered in my medical decision making (see MDM for details).  Medications Ordered in ED Medications  oxyCODONE  (Oxy IR/ROXICODONE ) immediate release tablet 5 mg (has no administration in time range)  acetaminophen  (TYLENOL ) tablet 1,000 mg (has no  administration in time range)  allopurinol  (ZYLOPRIM ) tablet 300 mg (has no administration in time range)  amLODipine  (NORVASC ) tablet 5 mg (has no administration in time range)  cholecalciferol  (VITAMIN D3) 25 MCG (1000 UNIT) tablet 1,000 Units (has no administration in time range)  docusate sodium  (COLACE) capsule 100 mg (has no administration in time range)  finasteride  (PROSCAR ) tablet 5 mg (has no administration in time range)  levothyroxine  (SYNTHROID ) tablet 25 mcg (has no administration in time range)  losartan (COZAAR) tablet 100 mg (has no administration in time range)  ondansetron  (ZOFRAN ) tablet 4 mg (has no administration in time range)  pravastatin  (PRAVACHOL ) tablet 40 mg (has no administration in time range)  tamsulosin  (FLOMAX ) capsule 0.4 mg (has no administration in time range)  oxyCODONE  (Oxy IR/ROXICODONE ) immediate release tablet 5 mg (5 mg Oral Given 06/12/23 2107)  ondansetron  (ZOFRAN ) injection 4 mg (4 mg Intravenous Given 06/12/23 2105)  acetaminophen  (TYLENOL ) tablet 1,000 mg (1,000 mg Oral Given 06/12/23 2107)                                                                                                                                     Procedures Procedures  (including critical care time)  Medical Decision Making / ED Course   This patient presents to the ED for concern of hip pain, this involves an extensive number of treatment options, and is a complaint that carries with it a high risk of complications and morbidity.  The differential diagnosis includes fracture, pelvis fracture, contusion.  MDM: Blood work ordered.  Will obtain plain films of the patient's hip.  Analgesia provided.  Patient alert, oriented.  There is no evidence of trauma to the head.  The rest of the patient's musculature appears uninjured.  He is complaining back.  Do not believe any additional imaging is indicated at this time.  Reassessment 11:45 PM-patient's plain films does show a  periprosthetic hip fracture.  Spoke with on-call orthopedic surgeon with [facility] who agreed with admission to hospitalist.  Patient's blood work reviewed, no anemia, no leukocytosis, no function at baseline.  Will admit patient.   Additional history obtained: -Additional history obtained from EMS -External records from outside source obtained and reviewed including: Chart review including previous notes, labs, imaging, consultation notes   Lab Tests: -I ordered, reviewed, and interpreted labs.   The pertinent results include:   Labs Reviewed  BASIC METABOLIC PANEL WITH GFR - Abnormal; Notable for the following components:      Result Value   Glucose, Bld 118 (*)    BUN 28 (*)    Creatinine, Ser 1.25 (*)    GFR, Estimated 57 (*)  All other components within normal limits  CBC WITH DIFFERENTIAL/PLATELET      EKG my independent review of the patient's EKG shows no ST segment depressions or elevations, no T wave inversions, no evidence of acute ischemia.  EKG Interpretation Date/Time:  Friday June 12 2023 21:11:35 EDT Ventricular Rate:  60 PR Interval:  232 QRS Duration:  88 QT Interval:  397 QTC Calculation: 397 R Axis:   -25  Text Interpretation: Sinus rhythm Prolonged PR interval Borderline left axis deviation Low voltage, precordial leads Confirmed by Afton Horse [phone]) on 06/12/2023 11:46:27 PM         Imaging Studies ordered: I ordered imaging studies including plain films of the hip I independently visualized and interpreted imaging. I agree with the radiologist interpretation   Medicines ordered and prescription drug management: Meds ordered this encounter  Medications   oxyCODONE  (Oxy IR/ROXICODONE ) immediate release tablet 5 mg    Refill:  0   ondansetron  (ZOFRAN ) injection 4 mg   acetaminophen  (TYLENOL ) tablet 1,000 mg   oxyCODONE  (Oxy IR/ROXICODONE ) immediate release tablet 5 mg    Refill:  0   acetaminophen  (TYLENOL ) tablet 1,000 mg    allopurinol  (ZYLOPRIM ) tablet 300 mg   amLODipine  (NORVASC ) tablet 5 mg   cholecalciferol  (VITAMIN D3) 25 MCG (1000 UNIT) tablet 1,000 Units   docusate sodium  (COLACE) capsule 100 mg   finasteride  (PROSCAR ) tablet 5 mg   levothyroxine  (SYNTHROID ) tablet 25 mcg   losartan (COZAAR) tablet 100 mg   ondansetron  (ZOFRAN ) tablet 4 mg   pravastatin  (PRAVACHOL ) tablet 40 mg   tamsulosin  (FLOMAX ) capsule 0.4 mg    -I have reviewed the patients home medicines and have made adjustments as needed  Cardiac Monitoring: The patient was maintained on a cardiac monitor.  I personally viewed and interpreted the cardiac monitored which showed an underlying rhythm of: Normal sinus rhythm  Social Determinants of Health:  Factors impacting patients care include: Multiple medical comorbidities including chronic kidney disease, hypertension and hyperlipidemia   Reevaluation: After the interventions noted above, I reevaluated the patient and found that they have :improved  Co morbidities that complicate the patient evaluation  Past Medical History:  Diagnosis Date   Arthritis    hands,    Complication of anesthesia    GERD (gastroesophageal reflux disease)    Gout    Hypertension    Hypothyroidism    PONV (postoperative nausea and vomiting)       Dispostion: Admission to hospitalist     Final Clinical Impression(s) / ED Diagnoses Final diagnoses:  Periprosthetic fracture of hip, initial encounter     @PCDICTATION @    Afton Horse T, DO 06/12/23 2348

## 2023-06-12 NOTE — ED Triage Notes (Signed)
 Patient BIB EMS from home c/o mechanical Fall. Per report tripped and fell on his left side. Per report patient unable to get up by himself and c/o severe pain on his left hip. 100 mcg Fenatnyl given by EMS. Patient denies LOC. Patient denies dizziness. Patient denies taking blood thinners. Hx of Left hip replacement.

## 2023-06-13 ENCOUNTER — Inpatient Hospital Stay (HOSPITAL_COMMUNITY)

## 2023-06-13 DIAGNOSIS — R001 Bradycardia, unspecified: Secondary | ICD-10-CM

## 2023-06-13 DIAGNOSIS — M978XXA Periprosthetic fracture around other internal prosthetic joint, initial encounter: Secondary | ICD-10-CM | POA: Diagnosis present

## 2023-06-13 DIAGNOSIS — M109 Gout, unspecified: Secondary | ICD-10-CM | POA: Diagnosis present

## 2023-06-13 DIAGNOSIS — N183 Chronic kidney disease, stage 3 unspecified: Secondary | ICD-10-CM

## 2023-06-13 DIAGNOSIS — R7303 Prediabetes: Secondary | ICD-10-CM | POA: Diagnosis present

## 2023-06-13 DIAGNOSIS — Z79890 Hormone replacement therapy (postmenopausal): Secondary | ICD-10-CM | POA: Diagnosis not present

## 2023-06-13 DIAGNOSIS — E785 Hyperlipidemia, unspecified: Secondary | ICD-10-CM | POA: Diagnosis present

## 2023-06-13 DIAGNOSIS — M9702XA Periprosthetic fracture around internal prosthetic left hip joint, initial encounter: Secondary | ICD-10-CM | POA: Diagnosis present

## 2023-06-13 DIAGNOSIS — Z66 Do not resuscitate: Secondary | ICD-10-CM | POA: Diagnosis present

## 2023-06-13 DIAGNOSIS — I10 Essential (primary) hypertension: Secondary | ICD-10-CM | POA: Diagnosis not present

## 2023-06-13 DIAGNOSIS — K219 Gastro-esophageal reflux disease without esophagitis: Secondary | ICD-10-CM | POA: Diagnosis present

## 2023-06-13 DIAGNOSIS — Y92015 Private garage of single-family (private) house as the place of occurrence of the external cause: Secondary | ICD-10-CM | POA: Diagnosis not present

## 2023-06-13 DIAGNOSIS — I440 Atrioventricular block, first degree: Secondary | ICD-10-CM | POA: Diagnosis present

## 2023-06-13 DIAGNOSIS — N400 Enlarged prostate without lower urinary tract symptoms: Secondary | ICD-10-CM | POA: Diagnosis present

## 2023-06-13 DIAGNOSIS — N1831 Chronic kidney disease, stage 3a: Secondary | ICD-10-CM | POA: Diagnosis present

## 2023-06-13 DIAGNOSIS — I129 Hypertensive chronic kidney disease with stage 1 through stage 4 chronic kidney disease, or unspecified chronic kidney disease: Secondary | ICD-10-CM | POA: Diagnosis present

## 2023-06-13 DIAGNOSIS — S7292XA Unspecified fracture of left femur, initial encounter for closed fracture: Secondary | ICD-10-CM | POA: Diagnosis present

## 2023-06-13 DIAGNOSIS — W010XXA Fall on same level from slipping, tripping and stumbling without subsequent striking against object, initial encounter: Secondary | ICD-10-CM | POA: Diagnosis present

## 2023-06-13 DIAGNOSIS — N179 Acute kidney failure, unspecified: Secondary | ICD-10-CM | POA: Diagnosis present

## 2023-06-13 DIAGNOSIS — Z7982 Long term (current) use of aspirin: Secondary | ICD-10-CM | POA: Diagnosis not present

## 2023-06-13 DIAGNOSIS — Z79899 Other long term (current) drug therapy: Secondary | ICD-10-CM | POA: Diagnosis not present

## 2023-06-13 DIAGNOSIS — Z96649 Presence of unspecified artificial hip joint: Secondary | ICD-10-CM | POA: Diagnosis not present

## 2023-06-13 DIAGNOSIS — E039 Hypothyroidism, unspecified: Secondary | ICD-10-CM | POA: Diagnosis present

## 2023-06-13 LAB — COMPREHENSIVE METABOLIC PANEL WITH GFR
ALT: 17 U/L (ref 0–44)
AST: 16 U/L (ref 15–41)
Albumin: 2.9 g/dL — ABNORMAL LOW (ref 3.5–5.0)
Alkaline Phosphatase: 57 U/L (ref 38–126)
Anion gap: 7 (ref 5–15)
BUN: 23 mg/dL (ref 8–23)
CO2: 21 mmol/L — ABNORMAL LOW (ref 22–32)
Calcium: 7.1 mg/dL — ABNORMAL LOW (ref 8.9–10.3)
Chloride: 113 mmol/L — ABNORMAL HIGH (ref 98–111)
Creatinine, Ser: 1.07 mg/dL (ref 0.61–1.24)
GFR, Estimated: >60 mL/min (ref >60)
Glucose, Bld: 96 mg/dL (ref 70–99)
Potassium: 3.5 mmol/L (ref 3.5–5.1)
Sodium: 141 mmol/L (ref 135–145)
Total Bilirubin: 0.7 mg/dL (ref 0.0–1.2)
Total Protein: 5.4 g/dL — ABNORMAL LOW (ref 6.5–8.1)

## 2023-06-13 LAB — CBC
HCT: 35.4 % — ABNORMAL LOW (ref 39.0–52.0)
Hemoglobin: 11.7 g/dL — ABNORMAL LOW (ref 13.0–17.0)
MCH: 31.1 pg (ref 26.0–34.0)
MCHC: 33.1 g/dL (ref 30.0–36.0)
MCV: 94.1 fL (ref 80.0–100.0)
Platelets: 183 10*3/uL (ref 150–400)
RBC: 3.76 MIL/uL — ABNORMAL LOW (ref 4.22–5.81)
RDW: 14.2 % (ref 11.5–15.5)
WBC: 6.7 10*3/uL (ref 4.0–10.5)
nRBC: 0 % (ref 0.0–0.2)

## 2023-06-13 LAB — TSH: TSH: 3.499 u[IU]/mL (ref 0.350–4.500)

## 2023-06-13 MED ORDER — NALOXONE HCL 0.4 MG/ML IJ SOLN: 0.4000 mg | INTRAMUSCULAR | Status: DC | PRN

## 2023-06-13 MED ORDER — LOSARTAN POTASSIUM 50 MG PO TABS
100.0000 mg | ORAL_TABLET | Freq: Every evening | ORAL | Status: DC
  Administered 2023-06-13 – 2023-06-14 (×2): 100 mg via ORAL
  Filled 2023-06-13 (×2): qty 2

## 2023-06-13 MED ORDER — SODIUM CHLORIDE 0.9 % IV SOLN: INTRAVENOUS | Status: AC

## 2023-06-13 MED ORDER — AMLODIPINE BESYLATE 5 MG PO TABS
5.0000 mg | ORAL_TABLET | Freq: Every evening | ORAL | Status: DC
  Administered 2023-06-13 – 2023-06-14 (×2): 5 mg via ORAL
  Filled 2023-06-13 (×2): qty 1

## 2023-06-13 MED ORDER — OXYCODONE HCL 5 MG PO TABS
5.0000 mg | ORAL_TABLET | Freq: Four times a day (QID) | ORAL | Status: DC | PRN
  Administered 2023-06-14 – 2023-06-15 (×2): 5 mg via ORAL
  Filled 2023-06-13 (×2): qty 1

## 2023-06-13 MED ORDER — LEVOTHYROXINE SODIUM 25 MCG PO TABS
25.0000 ug | ORAL_TABLET | Freq: Every day | ORAL | Status: DC
  Administered 2023-06-13 – 2023-06-15 (×3): 25 ug via ORAL
  Filled 2023-06-13 (×3): qty 1

## 2023-06-13 MED ORDER — PRAVASTATIN SODIUM 20 MG PO TABS
40.0000 mg | ORAL_TABLET | Freq: Every day | ORAL | Status: DC
  Administered 2023-06-13 – 2023-06-14 (×2): 40 mg via ORAL
  Filled 2023-06-13 (×2): qty 2

## 2023-06-13 MED ORDER — MORPHINE SULFATE (PF) 2 MG/ML IV SOLN: 1.0000 mg | INTRAVENOUS | Status: DC | PRN

## 2023-06-13 NOTE — Progress Notes (Signed)
 CT reviewed. Fracture pattern is [place] Ag with distal extension not exceeding area of porous ingrowth. Thus the femoral component is stable. Recommend nonoperative management: TDWB  LLE with walker. F/u in office in 2 weeks for x-rays. Call [phone] to schedule.

## 2023-06-13 NOTE — Progress Notes (Signed)
 Orthopedic Tech Progress Note Patient Details:  Jay Washington 03-Nov-1940 [id number]  Patient ID: Jay Washington, male   DOB: 01/15/1941, 83 y.o.   MRN: [id number] No OHF. Age restriction Jay Washington 06/13/2023, 10:15 AM

## 2023-06-13 NOTE — H&P (Signed)
 History and Physical    LABIB BUJNOWSKI WUJ:[id number] DOB: 04/18/1940 DOA: 06/12/2023  PCP: Edsel Grace, MD  Patient coming from: Home  Chief Complaint: Fall, left hip pain  HPI: Jay Washington is a 83 y.o. 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  Vital signs on arrival: Temperature 97.6 F, pulse 65, respiratory rate 18, blood pressure 142/84, SPO2 96% on room air.  No significant lab abnormalities on CBC and BMP.  X-ray showing periprosthetic fracture of the left hip. Patient was given Tylenol , amlodipine , losartan, Zofran , Oxy IR, and Flomax  in the ED.  ED physician has consulted on-call physician for [facility].  [facility] called to admit.  Patient states he was walking at home, accidentally stepped on a tin can and slipped and fell on his left hip.  He had severe pain in his hip and was not able to get up from the floor and EMS had to be called.  Denies head injury or loss of consciousness.  He takes a baby aspirin  daily and no other blood thinners.  Denies lightheadedness/dizziness, chest pain, shortness of breath, cough, nausea, vomiting, abdominal pain, diarrhea, or any urinary symptoms.  Review of Systems:  Review of Systems  All other systems reviewed and are negative.   Past Medical History:  Diagnosis Date   Arthritis    hands,    Complication of anesthesia    GERD (gastroesophageal reflux disease)    Gout    Hypertension    Hypothyroidism    PONV (postoperative nausea and vomiting)     Past Surgical History:  Procedure Laterality Date   APPENDECTOMY     JOINT REPLACEMENT Left    hip   TONSILLECTOMY     TOTAL HIP REVISION Left 01/26/2020   Procedure: TOTAL HIP REVISION head  ball and liner exchange;  Surgeon: Adonica Hoose, MD;  Location: [facility];  Service: Orthopedics;  Laterality: Left;     reports that he has never smoked. He has never used smokeless tobacco. He reports that he does not drink alcohol and does not use drugs.  No Known Allergies  History reviewed. No pertinent family history.  Prior to Admission medications   Medication Sig Start Date End Date Taking? Authorizing Provider  allopurinol  (ZYLOPRIM ) 300 MG tablet Take 300 mg by mouth daily.   Yes [provider]  amLODipine  (NORVASC ) 5 MG tablet Take 5 mg by mouth daily.   Yes [provider]  cholecalciferol  (VITAMIN D3) 25 MCG (1000 UNIT) tablet Take 1,000 Units by mouth daily.   Yes [provider]  clotrimazole-betamethasone (LOTRISONE) cream Apply 1 application topically 2 (two) times daily as needed (irritation).  10/19/19  Yes [provider]  finasteride  (PROSCAR ) 5 MG tablet Take 5 mg by mouth daily.   Yes [provider]  levothyroxine  (SYNTHROID ) 25 MCG tablet Take 25 mcg by mouth daily before breakfast.   Yes [provider]  losartan (COZAAR) 100 MG tablet Take 100 mg by mouth daily.   Yes [provider]  pravastatin  (PRAVACHOL ) 40 MG tablet Take 40 mg by mouth daily.   Yes [provider]    Physical Exam: Vitals:   06/13/23 0020 06/13/23 0027 06/13/23 0341 06/13/23 0430  BP:  (!) 169/73 Aaron Aas)  144/62 139/60  Pulse:   61 (!) 48  Resp:   18 14  Temp: 97.8 F (36.6 C)  97.6 F (36.4 C)   TempSrc:   Oral   SpO2:   94% 92%  Weight:      Height:        Physical Exam Vitals reviewed.  Constitutional:      General: He is not in acute distress. HENT:     Head: Normocephalic and atraumatic.  Eyes:     Extraocular Movements: Extraocular movements intact.  Cardiovascular:     Rate and Rhythm: Regular rhythm. Bradycardia present.     Pulses: Normal pulses.  Pulmonary:     Effort: Pulmonary effort is  normal. No respiratory distress.     Breath sounds: Normal breath sounds. No wheezing or rales.  Abdominal:     General: Bowel sounds are normal. There is no distension.     Palpations: Abdomen is soft.     Tenderness: There is no abdominal tenderness. There is no guarding.  Musculoskeletal:     Cervical back: Normal range of motion.     Right lower leg: No edema.     Left lower leg: No edema.     Comments: Left lower extremity neurovascularly intact  Skin:    General: Skin is warm and dry.  Neurological:     General: No focal deficit present.     Mental Status: He is alert and oriented to person, place, and time.     Labs on Admission: I have personally reviewed following labs and imaging studies  CBC: Recent Labs  Lab 06/12/23 2105  WBC 7.5  NEUTROABS 5.6  HGB 13.6  HCT 41.6  MCV 93.3  PLT 214   Basic Metabolic Panel: Recent Labs  Lab 06/12/23 2105  NA 137  K 4.1  CL 105  CO2 23  GLUCOSE 118*  BUN 28*  CREATININE 1.25*  CALCIUM 8.9   GFR: Estimated Creatinine Clearance: 51.2 mL/min (A) (by C-G formula based on SCr of 1.25 mg/dL (H)). Liver Function Tests: No results for input(s): "AST", "ALT", "ALKPHOS", "BILITOT", "PROT", "ALBUMIN" in the last 168 hours. No results for input(s): "LIPASE", "AMYLASE" in the last 168 hours. No results for input(s): "AMMONIA" in the last 168 hours. Coagulation Profile: No results for input(s): "INR", "PROTIME" in the last 168 hours. Cardiac Enzymes: No results for input(s): "CKTOTAL", "CKMB", "CKMBINDEX", "TROPONINI" in the last 168 hours. BNP (last 3 results) No results for input(s): "PROBNP" in the last 8760 hours. HbA1C: No results for input(s): "HGBA1C" in the last 72 hours. CBG: No results for input(s): "GLUCAP" in the last 168 hours. Lipid Profile: No results for input(s): "CHOL", "HDL", "LDLCALC", "TRIG", "CHOLHDL", "LDLDIRECT" in the last 72 hours. Thyroid  Function Tests: No results for input(s): "TSH",  "T4TOTAL", "FREET4", "T3FREE", "THYROIDAB" in the last 72 hours. Anemia Panel: No results for input(s): "VITAMINB12", "FOLATE", "FERRITIN", "TIBC", "IRON", "RETICCTPCT" in the last 72 hours. Urine analysis:    Component Value Date/Time   COLORURINE YELLOW 01/20/2020 0941   APPEARANCEUR CLEAR 01/20/2020 0941   LABSPEC 1.015 01/20/2020 0941   PHURINE 5.0 01/20/2020 0941   GLUCOSEU NEGATIVE 01/20/2020 0941   HGBUR SMALL (A) 01/20/2020 0941   BILIRUBINUR NEGATIVE 01/20/2020 0941   KETONESUR NEGATIVE 01/20/2020 0941   PROTEINUR NEGATIVE 01/20/2020 0941   NITRITE NEGATIVE 01/20/2020 0941   LEUKOCYTESUR NEGATIVE 01/20/2020 0941    Radiological Exams on Admission: DG Hip Unilat With Pelvis 2-3 Views Left Result Date: 06/12/2023 CLINICAL  DATA:  Fall, left hip pain EXAM: DG HIP (WITH OR WITHOUT PELVIS) 2-3V LEFT COMPARISON:  None Available. FINDINGS: Left hip arthroplasty in satisfactory position. However, there is a suspected periprosthetic fracture along the lateral aspect of the proximal femoral stem. Visualized bony pelvis appears intact. Degenerative changes of the lower lumbar spine. IMPRESSION: Left hip arthroplasty in satisfactory position. Suspected periprosthetic fracture along the lateral aspect of the proximal femoral stem. Electronically Signed   By: Zadie Herter M.D.   On: 06/12/2023 22:16    EKG: Independently reviewed.  Sinus rhythm with first-degree AV block, no significant change since previous tracing.  Assessment and Plan  Periprosthetic fracture of the left hip -[facility] consulted -Keep n.p.o. -Gentle IV fluid hydration -Continue pain management -Nonweightbearing  Sinus bradycardia Heart rate noted to be in the high 40s to 50s.  Patient is asymptomatic and reports history of chronic bradycardia.  Blood pressure stable.  EKG showing first-degree AV block which is not new.  He is not on AV nodal blocking agents.  Continue cardiac monitoring and check  TSH.  CKD stage IIIa Creatinine stable, monitor labs.  Hypertension Continue amlodipine  and losartan.  Hyperlipidemia Continue pravastatin .  Hypothyroidism Continue Synthroid  and check TSH.  Gout Continue allopurinol .  BPH Continue finasteride .  DVT prophylaxis: SCDs Code Status: DNR-prearrest interventions desired (discussed with the patient) Family Communication: No family available at this time. Consults called: [facility] Level of care: Telemetry bed Admission status: It is my clinical opinion that admission to INPATIENT is reasonable and necessary because of the expectation that this patient will require hospital care that crosses at least 2 midnights to treat this condition based on the medical complexity of the problems presented.  Given the aforementioned information, the predictability of an adverse outcome is felt to be significant.  Juliette Oh MD [facility]  If 7PM-7AM, please contact night-coverage [email or web address]  06/13/2023, 5:07 AM

## 2023-06-13 NOTE — Plan of Care (Signed)
   Problem: Coping: Goal: Level of anxiety will decrease Outcome: Progressing   Problem: Pain Managment: Goal: General experience of comfort will improve and/or be controlled Outcome: Progressing   Problem: Safety: Goal: Ability to remain free from injury will improve Outcome: Progressing

## 2023-06-13 NOTE — Progress Notes (Signed)
 Patient was seen and examined this morning briefly. Please see H&P by my colleague earlier this morning for details.  83 year old male with history of HTN, HLD, GERD, arthritis, no prior ischemic heart disease. Presented to ED last night after a mechanical fall leading to left hip pain  X-ray showed periprosthetic fracture of the left hip  Orthopedics has been called.  I confirmed. Will keep him n.p.o. for now Intervention per orthopedics Pain controlled with scheduled Tylenol , as needed oxycodone  and as needed morphine  IV.  Continued on amlodipine , losartan, pravastatin , Synthroid , Proscar .

## 2023-06-13 NOTE — Consult Note (Signed)
 ORTHOPAEDIC CONSULTATION  REQUESTING PHYSICIAN: Hoyt Macleod, MD  PCP:  Jay Grace, MD  Chief Complaint: left hip pain, fall  HPI: OSA Jay Washington is a 83 y.o. male with PMH significant of hypertension, hyperlipidemia, hypothyroidism, gout, GERD, arthritis, BPH, CKD stage IIIa, history of failed left total hip arthroplasty status post total hip revision head ball and liner exchange in December 2021 with Dr. Charol Washington, presented to the ED complaining of left hip pain after a mechanical fall. Patient states he was walking at home, accidentally stepped on a tin can and slipped and fell on his left hip.  He had severe pain in his hip and was not able to get up from the floor and EMS had to be called.  Denies head injury or loss of consciousness.  He takes a baby aspirin  daily and no other blood thinners. X-ray showing periprosthetic fracture of the left hip. [facility] called to admit. Orthopedics consulted for management of left hip fracture.    He lives at home with his wife. He reports that his hip was doing very well prior to fall. He normally does not ambulate with any assistive devices, but does have cane and walker at home. Denies any history of blood clots. He does have history prediabetes.  Past Medical History:  Diagnosis Date   Arthritis    hands,    Complication of anesthesia    GERD (gastroesophageal reflux disease)    Gout    Hypertension    Hypothyroidism    PONV (postoperative nausea and vomiting)    Past Surgical History:  Procedure Laterality Date   APPENDECTOMY     JOINT REPLACEMENT Left    hip   TONSILLECTOMY     TOTAL HIP REVISION Left 01/26/2020   Procedure: TOTAL HIP REVISION head ball and liner exchange;  Surgeon: Adonica Hoose, MD;  Location: [facility];  Service: Orthopedics;  Laterality: Left;   Social History   Socioeconomic History   Marital status: Single    Spouse name: Not on file   Number of children: Not on file   Years of education: Not on file    Highest education level: Not on file  Occupational History   Not on file  Tobacco Use   Smoking status: Never   Smokeless tobacco: Never  Vaping Use   Vaping status: Never Used  Substance and Sexual Activity   Alcohol use: Never   Drug use: Never   Sexual activity: Not on file  Other Topics Concern   Not on file  Social History Narrative   Not on file   Social Drivers of Health   Financial Resource Strain: Not on file  Food Insecurity: Low Risk  (11/11/2022)   Received from [facility]   Hunger Vital Sign    Worried About Running Out of Food in the Last Year: Never true    Ran Out of Food in the Last Year: Never true  Transportation Needs: No Transportation Needs (11/11/2022)   Received from [facility]    In the past 12 months, has lack of reliable transportation kept you from medical appointments, meetings, work or from getting things needed for daily living? : No  Physical Activity: Not on file  Stress: Not on file  Social Connections: Not on file   History reviewed. No pertinent family history. No Known Allergies Prior to Admission medications   Medication Sig Start Date End Date Taking? Authorizing Provider  allopurinol  (ZYLOPRIM ) 300 MG tablet Take 300  mg by mouth daily.   Yes [provider]  amLODipine  (NORVASC ) 5 MG tablet Take 5 mg by mouth daily.   Yes [provider]  cholecalciferol  (VITAMIN D3) 25 MCG (1000 UNIT) tablet Take 1,000 Units by mouth daily.   Yes [provider]  clotrimazole-betamethasone (LOTRISONE) cream Apply 1 application topically 2 (two) times daily as needed (irritation).  10/19/19  Yes [provider]  finasteride  (PROSCAR ) 5 MG tablet Take 5 mg by mouth daily.   Yes [provider]  levothyroxine  (SYNTHROID ) 25 MCG tablet Take 25 mcg by mouth daily before breakfast.   Yes [provider]  losartan (COZAAR) 100 MG tablet Take 100 mg by mouth daily.   Yes [provider]  pravastatin  (PRAVACHOL ) 40 MG tablet Take 40 mg by mouth daily.   Yes [provider]   DG Hip Unilat With Pelvis 2-3 Views Left Result Date: 06/12/2023 CLINICAL DATA:  Fall, left hip pain EXAM: DG HIP (WITH OR WITHOUT PELVIS) 2-3V LEFT COMPARISON:  None Available. FINDINGS: Left hip arthroplasty in satisfactory position. However, there is a suspected periprosthetic fracture along the lateral aspect of the proximal femoral stem. Visualized bony pelvis appears intact. Degenerative changes of the lower lumbar spine. IMPRESSION: Left hip arthroplasty in satisfactory position. Suspected periprosthetic fracture along the lateral aspect of the proximal femoral stem. Electronically Signed   By: Zadie Herter M.D.   On: 06/12/2023 22:16    Positive ROS: All other systems have been reviewed and were otherwise negative with the exception of those mentioned in the HPI and as above.  Physical Exam: General: Alert, no acute distress Cardiovascular: No pedal edema Respiratory: No cyanosis, no use of accessory musculature GI: No organomegaly, abdomen is soft and non-tender Skin: No lesions in the area of chief complaint Neurologic: Sensation intact distally Psychiatric: Patient is competent for consent with normal mood and affect  MUSCULOSKELETAL: Examination of the left hip reveals no skin wounds or lesions. He has healed anterior and posterior incisions. No warmth or erythema. Pain with palpation over lateral hip. No shortening or rotation noted.  Sensory and motor function intact in LE bilaterally including plantar flexion, dorsiflexion, and EHL. Distal pedal pulses 2+ bilaterally. Capillary refill <2 seconds. No edema in LE bilaterally. Calves soft and non-tender.   Assessment: Acute periprosthetic fracture left proximal femur.  Prediabetes, A1c 6.0  Plan: I discussed the findings with the patient. We reviewed his x-rays. He has an acute periprosthetic fracture of his left  proximal femur. He has already been admitted per TR[facility], appreciate their consult. We discussed further imaging is needed to determine operative vs non-operative management. X-ray left knee and CT scan left hip ordered. Okay to resume diet now, TRH suggests cardiac diet that has been placed. Patient to remain NWB LLE. Management pending CT scan. All questions solicited and answered.     Harman Lightning, PA-C    06/13/2023 10:45 AM

## 2023-06-14 DIAGNOSIS — E039 Hypothyroidism, unspecified: Secondary | ICD-10-CM

## 2023-06-14 DIAGNOSIS — M978XXA Periprosthetic fracture around other internal prosthetic joint, initial encounter: Secondary | ICD-10-CM | POA: Diagnosis not present

## 2023-06-14 DIAGNOSIS — R001 Bradycardia, unspecified: Secondary | ICD-10-CM | POA: Diagnosis not present

## 2023-06-14 DIAGNOSIS — N1831 Chronic kidney disease, stage 3a: Secondary | ICD-10-CM

## 2023-06-14 DIAGNOSIS — I10 Essential (primary) hypertension: Secondary | ICD-10-CM | POA: Diagnosis not present

## 2023-06-14 DIAGNOSIS — N400 Enlarged prostate without lower urinary tract symptoms: Secondary | ICD-10-CM

## 2023-06-14 DIAGNOSIS — E785 Hyperlipidemia, unspecified: Secondary | ICD-10-CM

## 2023-06-14 MED ORDER — ENSURE ENLIVE PO LIQD
237.0000 mL | Freq: Two times a day (BID) | ORAL | Status: DC
  Administered 2023-06-15 (×2): 237 mL via ORAL

## 2023-06-14 MED ORDER — ADULT MULTIVITAMIN W/MINERALS CH
1.0000 | ORAL_TABLET | Freq: Every day | ORAL | Status: DC
  Administered 2023-06-14 – 2023-06-15 (×2): 1 via ORAL
  Filled 2023-06-14 (×2): qty 1

## 2023-06-14 NOTE — Assessment & Plan Note (Signed)
 Blood pressure mildly elevated -Continue home losartan and amlodipine 

## 2023-06-14 NOTE — Assessment & Plan Note (Signed)
-  Continue home finasteride 

## 2023-06-14 NOTE — Assessment & Plan Note (Signed)
 Heart rate in 50s and 60s.  EKG with chronic first-degree heart block.  Not on any AV nodal blocking agents. - Continue to monitor

## 2023-06-14 NOTE — Progress Notes (Signed)
  Progress Note   Patient: Jay Washington:[id number] DOB: 1940-07-18 DOA: 06/12/2023     1 DOS: the patient was seen and examined on 06/14/2023   Brief hospital course: Taken from H&P.  Jay Washington is a 83 y.o. 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   On presentation hemodynamically stable, left hip x-ray showing periprosthetic fracture.   Orthopedic surgery was consulted and they were recommending nonoperative management and outpatient follow-up in 2 weeks.  4/27: Hemodynamically stable.  PT and OT evaluation      Assessment and Plan: * Periprosthetic hip fracture Secondary to mechanical fall.  Orthopedic is recommending conservative management. - Continue with pain control -PT/OT evaluation  Sinus bradycardia Heart rate in 50s and 60s.  EKG with chronic first-degree heart block.  Not on any AV nodal blocking agents. - Continue to monitor  CKD (chronic kidney disease) stage 3, GFR 30-59 ml/min (HCC) Renal function stable.  History of CKD stage IIIa -Monitor renal function -Avoid nephrotoxins  HTN (hypertension) Blood pressure mildly elevated -Continue home losartan and amlodipine   HLD (hyperlipidemia) - Continue pravastatin   Hypothyroidism TSH normal -Continue home Synthroid   BPH (benign prostatic hyperplasia) - Continue home finasteride       Subjective: Patient was sitting in chair and eating lunch when seen today.  Pain seems well-controlled, per patient pain is okay as far as he does not ambulate.  Sister at bedside.  Physical Exam: Vitals:   06/13/23 2148 06/14/23 0154 06/14/23 0545 06/14/23 0950  BP: (!) 136/56 126/82 (!) 158/64 (!) 145/61  Pulse: 65 (!) 54 64  62  Resp: 18 18 18 18   Temp: 97.7 F (36.5 C) 98 F (36.7 C) 97.7 F (36.5 C) 97.6 F (36.4 C)  TempSrc: Oral  Oral Oral  SpO2: 92% 94% 94% 95%  Weight:      Height:       General.  Overweight elderly man, in no acute distress. Pulmonary.  Lungs clear bilaterally, normal respiratory effort. CV.  Regular rate and rhythm, no JVD, rub or murmur. Abdomen.  Soft, nontender, nondistended, BS positive. CNS.  Alert and oriented .  No focal neurologic deficit. Extremities.  No edema, no cyanosis, pulses intact and symmetrical. Psychiatry.  Judgment and insight appears normal.   Data Reviewed: Prior data reviewed  Family Communication: Discussed with sister at bedside  Disposition: Status is: Inpatient Remains inpatient appropriate because: Severity of illness  Planned Discharge Destination: Home with Home Health  Time spent: 45 minutes  This record has been created using Conservation officer, historic buildings. Errors have been sought and corrected,but may not always be located. Such creation errors do not reflect on the standard of care.   Author: Luna Salinas, MD 06/14/2023 1:03 PM  For on call review [email or web address].

## 2023-06-14 NOTE — Evaluation (Signed)
 Physical Therapy Evaluation Patient Details Name: Jay Washington MRN: [id number] DOB: [birth year] Today's Date: 06/14/2023  History of Present Illness  Pt s/p fall with non-displaced L THR peri-prothetic fx.  Pt with hx of L THR with revision and gout  Clinical Impression  Pt admitted as above and presenting with functional mobility limitations 2* decreased L LE strength/ROM, post op pain and TWB on L LE.  Pt very motivated and should progress well to dc home with assist of family/friends.          If plan is discharge home, recommend the following: A little help with walking and/or transfers;A little help with bathing/dressing/bathroom;Assistance with cooking/housework;Assist for transportation;Help with stairs or ramp for entrance   Can travel by private vehicle        Equipment Recommendations None recommended by PT  Recommendations for Other Services       Functional Status Assessment Patient has had a recent decline in their functional status and demonstrates the ability to make significant improvements in function in a reasonable and predictable amount of time.     Precautions / Restrictions Precautions Precautions: Fall Recall of Precautions/Restrictions: Intact Restrictions Weight Bearing Restrictions Per Provider Order: Yes LLE Weight Bearing Per Provider Order: Touchdown weight bearing      Mobility  Bed Mobility Overal bed mobility: Needs Assistance Bed Mobility: Supine to Sit     Supine to sit: Min assist     General bed mobility comments: cues for sequence and limiting use of L LE    Transfers Overall transfer level: Needs assistance Equipment used: Rolling walker (2 wheels) Transfers: Sit to/from Stand Sit to Stand: Min assist           General transfer comment: cues for LE management, TDWB and use of UEs to self assist    Ambulation/Gait Ambulation/Gait assistance: Min assist, Contact guard assist Gait Distance (Feet): 41 Feet Assistive  device: Rolling walker (2 wheels) Gait Pattern/deviations: Step-to pattern, Decreased step length - right, Decreased step length - left, Shuffle, Trunk flexed Gait velocity: decr     General Gait Details: cues for sequence, posture, and TDWB - pt with good compliance with WB limitations  Stairs            Wheelchair Mobility     Tilt Bed    Modified Rankin (Stroke Patients Only)       Balance Overall balance assessment: Needs assistance Sitting-balance support: No upper extremity supported, Feet supported Sitting balance-[name]: Good     Standing balance support: No upper extremity supported Standing balance-[name]: Fair                               Pertinent Vitals/Pain      Home Living Family/patient expects to be discharged to:: Private residence Living Arrangements: Spouse/significant other Available Help at Discharge: Family Type of Home: House Home Access: Stairs to enter Entrance Stairs-Rails: None Entrance Stairs-Number of Steps: 3   Home Layout: One level Home Equipment: Agricultural consultant (2 wheels);Cane - single point;Crutches      Prior Function Prior Level of Function : Independent/Modified Independent                     Extremity/Trunk Assessment   Upper Extremity Assessment Upper Extremity Assessment: Overall WFL for tasks assessed    Lower Extremity Assessment Lower Extremity Assessment: LLE deficits/detail    Cervical / Trunk Assessment Cervical / Trunk Assessment:  Normal  Communication   Communication Communication: No apparent difficulties    Cognition Arousal: Alert Behavior During Therapy: WFL for tasks assessed/performed                             Following commands: Intact       Cueing Cueing Techniques: Verbal cues     General Comments      Exercises     Assessment/Plan    PT Assessment Patient needs continued PT services  PT Problem List Decreased activity  tolerance;Decreased balance;Decreased mobility;Decreased knowledge of use of DME;Pain       PT Treatment Interventions DME instruction;Gait training;Stair training;Functional mobility training;Therapeutic activities;Therapeutic exercise;Patient/family education    PT Goals (Current goals can be found in the Care Plan section)  Acute Rehab PT Goals Patient Stated Goal: Regain IND PT Goal Formulation: With patient Time For Goal Achievement: 06/28/23 Potential to Achieve Goals: Good    Frequency 7X/week     Co-evaluation               AM-PAC PT "6 Clicks" Mobility  Outcome Measure Help needed turning from your back to your side while in a flat bed without using bedrails?: A Little Help needed moving from lying on your back to sitting on the side of a flat bed without using bedrails?: A Little Help needed moving to and from a bed to a chair (including a wheelchair)?: A Little Help needed standing up from a chair using your arms (e.g., wheelchair or bedside chair)?: A Little Help needed to walk in hospital room?: A Little Help needed climbing 3-5 steps with a railing? : A Lot 6 Click Score: 17    End of Session Equipment Utilized During Treatment: Gait belt Activity Tolerance: Patient tolerated treatment well Patient left: in chair;with call bell/phone within reach;with chair alarm set Nurse Communication: Mobility status PT Visit Diagnosis: Difficulty in walking, not elsewhere classified (R26.2)    Time: 1111-1140 PT Time Calculation (min) (ACUTE ONLY): 29 min   Charges:   PT Evaluation $PT Eval Low Complexity: 1 Low PT Treatments $Gait Training: 8-22 mins PT General Charges $$ ACUTE PT VISIT: 1 Visit         Thedora Finlay PT Acute Rehabilitation Services Pager [phone] Office [phone]   Iretha Kirley 06/14/2023, 12:51 PM

## 2023-06-14 NOTE — Progress Notes (Signed)
 Initial Nutrition Assessment  DOCUMENTATION CODES:   Not applicable  INTERVENTION:  Ensure Enlive po BID, each supplement provides 350 kcal and 20 grams of protein.  Multivitamin with minerals  Continue Vit D3 supplementation  Encourage PO intake  NUTRITION DIAGNOSIS:   Increased nutrient needs related to post-op healing as evidenced by estimated needs.  GOAL:   Patient will meet greater than or equal to 90% of their needs  MONITOR:   PO intake, Supplement acceptance  REASON FOR ASSESSMENT:   Consult Hip fracture protocol  ASSESSMENT:   PMH hypertension, hyperlipidemia, hypothyroidism, gout, GERD, arthritis, BPH, CKD stage IIIa, history of failed left total hip arthroplasty status post total hip revision head ball and liner exchange in December 2021 presented to the ED complaining of left hip pain after a mechanical fall. X-ray showing periprosthetic fracture of the left hip. Recommending nonoperative management.  RD working remotely and unable to reach pt via room phone.   Intake: Nursing flowsheets document meal completions between 75-100% x2.  Medications reviewed and include: Vit D3, synthroid   Labs reviewed: Albumin 2.9    Intake/Output Summary (Last 24 hours) at 06/14/2023 1203 Last data filed at 06/14/2023 0865 Gross per 24 hour  Intake 1060 ml  Output 1375 ml  Net -315 ml    Weights reviewed. Admit weight 96.2 kg.   NUTRITION - FOCUSED PHYSICAL EXAM:  Defer to f/u  Diet Order:   Diet Order             Diet Heart Room service appropriate? Yes; Fluid consistency: Thin  Diet effective now                   EDUCATION NEEDS:   Not appropriate for education at this time  Skin:  Skin Assessment: Reviewed RN Assessment  Last BM:  4/25  Height:   Ht Readings from Last 1 Encounters:  06/12/23 5\' 9"  (1.753 m)    Weight:   Wt Readings from Last 1 Encounters:  06/12/23 96.2 kg    BMI:  Body mass index is 31.32 kg/m.  Estimated  Nutritional Needs:   Kcal:  1900-2100  Protein:  115-140  Fluid:  >1.9 L/day or per MD  Laren Player, MPH, RD, LDN Clinical Dietitian Contact information can be found at [facility].

## 2023-06-14 NOTE — Assessment & Plan Note (Signed)
 Continue pravastatin

## 2023-06-14 NOTE — Assessment & Plan Note (Signed)
 Renal function stable.  History of CKD stage IIIa -Monitor renal function -Avoid nephrotoxins

## 2023-06-14 NOTE — Assessment & Plan Note (Signed)
 TSH normal -Continue home Synthroid

## 2023-06-14 NOTE — Plan of Care (Signed)
   Problem: Coping: Goal: Level of anxiety will decrease Outcome: Progressing   Problem: Pain Managment: Goal: General experience of comfort will improve and/or be controlled Outcome: Progressing   Problem: Safety: Goal: Ability to remain free from injury will improve Outcome: Progressing

## 2023-06-14 NOTE — Hospital Course (Signed)
 Taken from H&P.  Jay Washington is a 83 y.o. 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   On presentation hemodynamically stable, left hip x-ray showing periprosthetic fracture.   Orthopedic surgery was consulted and they were recommending nonoperative management and outpatient follow-up in 2 weeks.  4/28: Patient remained hemodynamically stable, slight increase in creatinine today, no difficulty in urination with history of BPH.  Per patient he is urinating fine, appears little dry so gave 1 L of IV fluid and ask him to keep himself well-hydrated.  He is also being given oxycodone  to use as needed if Tylenol  does not work.  Patient will continue the rest of his home medications.  Home health PT and OT was ordered as recommended by physical therapy.  Patient need to follow-up with orthopedic surgery and primary care provider.

## 2023-06-14 NOTE — Assessment & Plan Note (Signed)
 Secondary to mechanical fall.  Orthopedic is recommending conservative management. - Continue with pain control -PT/OT evaluation

## 2023-06-15 DIAGNOSIS — M978XXA Periprosthetic fracture around other internal prosthetic joint, initial encounter: Secondary | ICD-10-CM | POA: Diagnosis not present

## 2023-06-15 DIAGNOSIS — I10 Essential (primary) hypertension: Secondary | ICD-10-CM | POA: Diagnosis not present

## 2023-06-15 DIAGNOSIS — R001 Bradycardia, unspecified: Secondary | ICD-10-CM | POA: Diagnosis not present

## 2023-06-15 DIAGNOSIS — N1831 Chronic kidney disease, stage 3a: Secondary | ICD-10-CM | POA: Diagnosis not present

## 2023-06-15 LAB — BASIC METABOLIC PANEL WITH GFR
Anion gap: 11 (ref 5–15)
BUN: 32 mg/dL — ABNORMAL HIGH (ref 8–23)
CO2: 22 mmol/L (ref 22–32)
Calcium: 9.3 mg/dL (ref 8.9–10.3)
Chloride: 105 mmol/L (ref 98–111)
Creatinine, Ser: 1.42 mg/dL — ABNORMAL HIGH (ref 0.61–1.24)
GFR, Estimated: 49 mL/min — ABNORMAL LOW (ref >60)
Glucose, Bld: 117 mg/dL — ABNORMAL HIGH (ref 70–99)
Potassium: 4.3 mmol/L (ref 3.5–5.1)
Sodium: 138 mmol/L (ref 135–145)

## 2023-06-15 LAB — CBC
HCT: 42.4 % (ref 39.0–52.0)
Hemoglobin: 14.3 g/dL (ref 13.0–17.0)
MCH: 31.3 pg (ref 26.0–34.0)
MCHC: 33.7 g/dL (ref 30.0–36.0)
MCV: 92.8 fL (ref 80.0–100.0)
Platelets: 209 10*3/uL (ref 150–400)
RBC: 4.57 MIL/uL (ref 4.22–5.81)
RDW: 14.3 % (ref 11.5–15.5)
WBC: 7.6 10*3/uL (ref 4.0–10.5)
nRBC: 0 % (ref 0.0–0.2)

## 2023-06-15 MED ORDER — OXYCODONE HCL 5 MG PO TABS: 5.0000 mg | ORAL_TABLET | Freq: Four times a day (QID) | ORAL | 0 refills | Status: AC | PRN

## 2023-06-15 MED ORDER — LACTATED RINGERS IV SOLN: INTRAVENOUS | Status: AC

## 2023-06-15 MED ORDER — ENSURE ENLIVE PO LIQD: 237.0000 mL | Freq: Two times a day (BID) | ORAL | 12 refills | Status: AC

## 2023-06-15 MED ORDER — ONDANSETRON HCL 4 MG PO TABS: 4.0000 mg | ORAL_TABLET | Freq: Four times a day (QID) | ORAL | 0 refills | Status: AC | PRN

## 2023-06-15 MED ORDER — ADULT MULTIVITAMIN W/MINERALS CH: 1.0000 | ORAL_TABLET | Freq: Every day | ORAL | 1 refills | Status: AC

## 2023-06-15 NOTE — TOC Transition Note (Signed)
 Transition of Care [facility]) - Discharge Note   Patient Details  Name: Jay Washington MRN: [id number] Date of Birth: 1940-06-03  [facility]) CM/SW Contact:  Amaryllis Junior, LCSW Phone Number: 06/15/2023, 11:18 AM   Clinical Narrative:    Pt medically ready to dc home with HHPT. [facility] will be providing HH services. HH information added to AVS. No DME needed. No further TOC needs.   Final next level of care: Home w Home Health Services Barriers to Discharge: No Barriers Identified   Patient Goals and CMS Choice Patient states their goals for this hospitalization and ongoing recovery are:: return home CMS Medicare.gov Compare Post Acute Care list provided to:: Patient Choice offered to / list presented to : Patient [place] ownership interest in [facility].provided to:: Patient    Discharge Placement                       Discharge Plan and Services Additional resources added to the After Visit Summary for                  DME Arranged: N/A DME Agency: NA       HH Arranged: PT HH Agency: [facility] Date [facility] Agency Contacted: 06/15/23 Time HH Agency Contacted: 1118 Representative spoke with at [facility] Agency: Amy  Social Drivers of Health (SDOH) Interventions SDOH Screenings   Food Insecurity: No Food Insecurity (06/14/2023)  Housing: Low Risk  (06/14/2023)  Transportation Needs: No Transportation Needs (06/13/2023)  Utilities: Not At Risk (06/14/2023)  Social Connections: Socially Isolated (06/13/2023)  Tobacco Use: Low Risk  (06/12/2023)     Readmission Risk Interventions    06/15/2023   11:16 AM  Readmission Risk Prevention Plan  Post Dischage Appt Complete  Medication Screening Complete  Transportation Screening Complete

## 2023-06-15 NOTE — Progress Notes (Signed)
 Spoke with patient. We discussed TDWB LLE with walker for at least 6 weeks. Avoid active hip abduction for at least 6 weeks. He will follow-up in office in 2 weeks for repeat evaluation.

## 2023-06-15 NOTE — Progress Notes (Signed)
 Physical Therapy Treatment Patient Details Name: Jay Washington MRN: [id number] DOB: Oct 09, 1940 Today's Date: 06/15/2023   History of Present Illness Pt s/p fall with non-displaced L THR peri-prothetic fx.  Pt with hx of L THR with revision and gout    PT Comments  Pt is progressing well this morning. Tol incr gait distance with good compliance with TDWB. Will see again for stair training in preparation for possible d/c later today.    If plan is discharge home, recommend the following: A little help with walking and/or transfers;A little help with bathing/dressing/bathroom;Assistance with cooking/housework;Assist for transportation;Help with stairs or ramp for entrance   Can travel by private vehicle        Equipment Recommendations  None recommended by PT    Recommendations for Other Services       Precautions / Restrictions Precautions Precautions: Fall Recall of Precautions/Restrictions: Intact Restrictions Weight Bearing Restrictions Per Provider Order: Yes LLE Weight Bearing Per Provider Order: Touchdown weight bearing     Mobility  Bed Mobility Overal bed mobility: Needs Assistance Bed Mobility: Supine to Sit     Supine to sit: Supervision, HOB elevated     General bed mobility comments: cues for sequence and limiting use of L LE    Transfers Overall transfer level: Needs assistance Equipment used: Rolling walker (2 wheels) Transfers: Sit to/from Stand Sit to Stand: Contact guard assist           General transfer comment: cues for LE management, TDWB and use of UEs to self assist    Ambulation/Gait Ambulation/Gait assistance: Contact guard assist Gait Distance (Feet): 60 Feet Assistive device: Rolling walker (2 wheels) Gait Pattern/deviations: Step-to pattern, Shuffle, Trunk flexed Gait velocity: decr     General Gait Details: cues for sequence, posture, and TDWB - pt with good compliance with WB limitations   Stairs              Wheelchair Mobility     Tilt Bed    Modified Rankin (Stroke Patients Only)       Balance Overall balance assessment: Needs assistance Sitting-balance support: No upper extremity supported, Feet supported Sitting balance-[name]: Good     Standing balance support: No upper extremity supported Standing balance-[name]: Fair                              Hotel manager: No apparent difficulties  Cognition Arousal: Alert Behavior During Therapy: WFL for tasks assessed/performed   PT - Cognitive impairments: No apparent impairments                         Following commands: Intact      Cueing Cueing Techniques: Verbal cues  Exercises General Exercises - Lower Extremity Ankle Circles/Pumps: AROM, Both, 10 reps Quad Sets: AROM, Both, 10 reps Heel Slides: AAROM, Left, 10 reps, AROM    General Comments        Pertinent Vitals/Pain      Home Living                          Prior Function            PT Goals (current goals can now be found in the care plan section) Acute Rehab PT Goals Patient Stated Goal: Regain IND PT Goal Formulation: With patient Time For Goal Achievement: 06/28/23 Potential to Achieve Goals: Good  Progress towards PT goals: Progressing toward goals    Frequency    7X/week      PT Plan      Co-evaluation              AM-PAC PT "6 Clicks" Mobility   Outcome Measure  Help needed turning from your back to your side while in a flat bed without using bedrails?: A Little Help needed moving from lying on your back to sitting on the side of a flat bed without using bedrails?: A Little Help needed moving to and from a bed to a chair (including a wheelchair)?: A Little Help needed standing up from a chair using your arms (e.g., wheelchair or bedside chair)?: A Little Help needed to walk in hospital room?: A Little Help needed climbing 3-5 steps with a railing?  : A Lot 6 Click Score: 17    End of Session Equipment Utilized During Treatment: Gait belt Activity Tolerance: Patient tolerated treatment well Patient left: in chair;with call bell/phone within reach;with chair alarm set;with family/visitor present Nurse Communication: Mobility status PT Visit Diagnosis: Difficulty in walking, not elsewhere classified (R26.2)     Time: 1002-1016 PT Time Calculation (min) (ACUTE ONLY): 14 min  Charges:    $Gait Training: 8-22 mins PT General Charges $$ ACUTE PT VISIT: 1 Visit                     Shaunie Boehm, PT  Acute Rehab Dept [facility]) [phone]  06/15/2023    [facility] 06/15/2023, 10:19 AM

## 2023-06-15 NOTE — Discharge Instructions (Signed)
 Please remain touchdown weightbearing for at least 6 weeks on left lower extremity with a walker.  Avoid active hip abduction for 6 weeks.  Follow-up in office in 2 weeks for repeat evaluation and x-rays.

## 2023-06-15 NOTE — Discharge Summary (Signed)
 Physician Discharge Summary   Patient: Jay Washington MRN: [id number] DOB: 27-Feb-1940  Admit date:     06/12/2023  Discharge date: 06/15/23  Discharge Physician: Jay Washington   PCP: Edsel Grace, MD   Recommendations at discharge:  Please obtain CBC and BMP on follow-up Follow-up with primary care provider Follow-up with orthopedic surgery  Discharge Diagnoses: Principal Problem:   Periprosthetic hip fracture Active Problems:   Sinus bradycardia   CKD (chronic kidney disease) stage 3, GFR 30-59 ml/min (HCC)   HTN (hypertension)   HLD (hyperlipidemia)   Hypothyroidism   BPH (benign prostatic hyperplasia)   Hospital Course: Taken from H&P.  Jay Washington is a 83 y.o. 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   On presentation hemodynamically stable, left hip x-ray showing periprosthetic fracture.   Orthopedic surgery was consulted and they were recommending nonoperative management and outpatient follow-up in 2 weeks.  4/28: Patient remained hemodynamically stable, slight increase in creatinine today, no difficulty in urination with history of BPH.  Per patient he is urinating fine, appears little dry so gave 1 L of IV fluid and ask him to keep himself well-hydrated.  He is also being given oxycodone  to use as needed if Tylenol  does not work.  Patient will continue the rest of his home medications.  Home health PT and OT was ordered as recommended by physical therapy.  Patient need to follow-up with orthopedic surgery and primary care provider.  Assessment and Plan: * Periprosthetic hip fracture Secondary to mechanical fall.  Orthopedic is recommending conservative management. - Continue  with pain control -PT/OT evaluation was obtained and they were recommending home health services which was ordered.  Sinus bradycardia Heart rate in 50s and 60s.  EKG with chronic first-degree heart block.  Not on any AV nodal blocking agents. - Continue to monitor  CKD (chronic kidney disease) stage 3, GFR 30-59 ml/min (HCC) AKI with some increase in creatinine today, history of CKD stage IIIa.  No difficulty urinating but he was not drinking enough.  Gave 1 L of IV fluid and encourage p.o. hydration -Monitor renal function -Avoid nephrotoxins  HTN (hypertension) Blood pressure mildly elevated -Continue home losartan and amlodipine   HLD (hyperlipidemia) - Continue pravastatin   Hypothyroidism TSH normal -Continue home Synthroid   BPH (benign prostatic hyperplasia) - Continue home finasteride      Pain control - [place]  Controlled Substance Reporting System database was reviewed. and patient was instructed, not to drive, operate heavy machinery, perform activities at heights, swimming or participation in water  activities or provide baby-sitting services while on Pain, Sleep and Anxiety Medications; until their outpatient Physician has advised to do so again. Also recommended to not to take more than prescribed Pain, Sleep and Anxiety Medications.  Consultants: Orthopedic surgery Procedures performed: None Disposition: Home health Diet recommendation:  Discharge Diet Orders (From admission, onward)     Start     Ordered   06/15/23 0000  Diet - low sodium heart healthy        06/15/23 1044           Cardiac diet DISCHARGE MEDICATION: Allergies as of 06/15/2023   No Known Allergies      Medication List     TAKE these medications  allopurinol  300 MG tablet Commonly known as: ZYLOPRIM  Take 300 mg by mouth daily.   amLODipine  5 MG tablet Commonly known as: NORVASC  Take 5 mg by mouth daily.   cholecalciferol  25 MCG (1000 UNIT) tablet Commonly known as:  VITAMIN D3 Take 1,000 Units by mouth daily.   clotrimazole-betamethasone cream Commonly known as: LOTRISONE Apply 1 application topically 2 (two) times daily as needed (irritation).   feeding supplement Liqd Take 237 mLs by mouth 2 (two) times daily between meals.   finasteride  5 MG tablet Commonly known as: PROSCAR  Take 5 mg by mouth daily.   levothyroxine  25 MCG tablet Commonly known as: SYNTHROID  Take 25 mcg by mouth daily before breakfast.   losartan 100 MG tablet Commonly known as: COZAAR Take 100 mg by mouth daily.   multivitamin with minerals Tabs tablet Take 1 tablet by mouth daily.   ondansetron  4 MG tablet Commonly known as: ZOFRAN  Take 1 tablet (4 mg total) by mouth every 6 (six) hours as needed for nausea.   oxyCODONE  5 MG immediate release tablet Commonly known as: Oxy IR/ROXICODONE  Take 1 tablet (5 mg total) by mouth every 6 (six) hours as needed for moderate pain (pain score 4-6).   pravastatin  40 MG tablet Commonly known as: PRAVACHOL  Take 40 mg by mouth daily.        Follow-up Information     Edsel Grace, MD. Schedule an appointment as soon as possible for a visit in 1 week(s).   Specialty: Internal Medicine Contact information: [address] [place] [address] [phone]         Adonica Hoose, MD. Schedule an appointment as soon as possible for a visit in 1 week(s).   Specialty: Orthopedic Surgery Contact information: [address] [place] [address] [phone]                Discharge Exam: Jay Washington Weights   06/12/23 1955  Weight: 96.2 kg   General.  Well-developed elderly man, in no acute distress. Pulmonary.  Lungs clear bilaterally, normal respiratory effort. CV.  Regular rate and rhythm, no JVD, rub or murmur. Abdomen.  Soft, nontender, nondistended, BS positive. CNS.  Alert and oriented .  No focal neurologic deficit. Extremities.  No edema, no cyanosis, pulses intact  and symmetrical. Psychiatry.  Judgment and insight appears normal.   Condition at discharge: stable  The results of significant diagnostics from this hospitalization (including imaging, microbiology, ancillary and laboratory) are listed below for reference.   Imaging Studies: DG Knee 1-2 Views Left Result Date: 06/13/2023 CLINICAL DATA:  Preoperative evaluation with known proximal periprosthetic femoral fracture EXAM: LEFT KNEE - 1-2 VIEW COMPARISON:  None Available. FINDINGS: Mild degenerative changes are noted. No joint effusion is seen. No acute fracture or dislocation is noted. IMPRESSION: Degenerative change without acute abnormality. Electronically Signed   By: Violeta Grey M.D.   On: 06/13/2023 14:34   CT HIP LEFT WO CONTRAST Result Date: 06/13/2023 CLINICAL DATA:  Fall.  Left hip pain.  Abnormal x-ray. EXAM: CT OF THE LEFT HIP WITHOUT CONTRAST TECHNIQUE: Multidetector CT imaging of the left hip was performed according to the standard protocol. Multiplanar CT image reconstructions were also generated. RADIATION DOSE REDUCTION: This exam was performed according to the departmental dose-optimization program which includes automated exposure control, adjustment of the mA and/or kV according to patient size and/or use of iterative reconstruction technique. COMPARISON:  Left hip radiographs 06/12/2023 FINDINGS: Bones/Joint/Cartilage Left total hip arthroplasty noted. The hip is located.  Acetabular component is well seated. A nondisplaced oblique fracture is confirmed in the proximal left femoral metaphysis, just below the greater trochanter. Additional fractures are present. Fracture extends across the ventral aspect of the bone. Fracture plane is just anterior to the prosthesis. The femur distal to the prosthesis is intact. Ligaments Suboptimally assessed by CT. Muscles and Tendons Within normal limits. Soft tissues Mild subcutaneous edema is noted lateral to the fracture. IMPRESSION: 1. Nondisplaced  oblique fracture of the proximal left femoral metaphysis, just below the greater trochanter. 2. Left total hip arthroplasty without evidence for complication. Electronically Signed   By: Audree Leas M.D.   On: 06/13/2023 14:16   DG Hip Unilat With Pelvis 2-3 Views Left Result Date: 06/12/2023 CLINICAL DATA:  Fall, left hip pain EXAM: DG HIP (WITH OR WITHOUT PELVIS) 2-3V LEFT COMPARISON:  None Available. FINDINGS: Left hip arthroplasty in satisfactory position. However, there is a suspected periprosthetic fracture along the lateral aspect of the proximal femoral stem. Visualized bony pelvis appears intact. Degenerative changes of the lower lumbar spine. IMPRESSION: Left hip arthroplasty in satisfactory position. Suspected periprosthetic fracture along the lateral aspect of the proximal femoral stem. Electronically Signed   By: Zadie Herter M.D.   On: 06/12/2023 22:16    Microbiology: Results for orders placed or performed during the hospital encounter of 01/23/20  SARS CORONAVIRUS 2 (TAT 6-24 HRS) Nasopharyngeal Nasopharyngeal Swab     Status: None   Collection Time: 01/23/20 11:01 AM   Specimen: Nasopharyngeal Swab  Result Value Ref Range Status   SARS Coronavirus 2 NEGATIVE NEGATIVE Final    Comment: (NOTE) SARS-CoV-2 target nucleic acids are NOT DETECTED.  The SARS-CoV-2 RNA is generally detectable in upper and lower respiratory specimens during the acute phase of infection. Negative results do not preclude SARS-CoV-2 infection, do not rule out co-infections with other pathogens, and should not be used as the sole basis for treatment or other patient management decisions. Negative results must be combined with clinical observations, patient history, and epidemiological information. The expected result is Negative.  Fact Sheet for Patients: HairSlick.no  Fact Sheet for Healthcare Providers: quierodirigir.com  This test  is not yet approved or cleared by the [place]  FDA and  has been authorized for detection and/or diagnosis of SARS-CoV-2 by FDA under an Emergency Use Authorization (EUA). This EUA will remain  in effect (meaning this test can be used) for the duration of the COVID-19 declaration under Se ction 564(b)(1) of the Act, 21 U.S.C. section 360bbb-3(b)(1), unless the authorization is terminated or revoked sooner.  Performed at [facility], [address] [place], [place] [address]     Labs: CBC: Recent Labs  Lab 06/12/23 2105 06/13/23 0715 06/15/23 0319  WBC 7.5 6.7 7.6  NEUTROABS 5.6  --   --   HGB 13.6 11.7* 14.3  HCT 41.6 35.4* 42.4  MCV 93.3 94.1 92.8  PLT 214 183 209   Basic Metabolic Panel: Recent Labs  Lab 06/12/23 2105 06/13/23 0715 06/15/23 0319  NA 137 141 138  K 4.1 3.5 4.3  CL 105 113* 105  CO2 23 21* 22  GLUCOSE 118* 96 117*  BUN 28* 23 32*  CREATININE 1.25* 1.07 1.42*  CALCIUM 8.9 7.1* 9.3   Liver Function Tests: Recent Labs  Lab 06/13/23 0715  AST 16  ALT 17  ALKPHOS 57  BILITOT 0.7  PROT 5.4*  ALBUMIN 2.9*   CBG: No results for input(s): "GLUCAP" in the last 168 hours.  Discharge time spent: greater than 30 minutes.  This record has been created using Conservation officer, historic buildings. Errors have been sought and corrected,but may not always be located. Such creation errors do not reflect on the standard of care.   Signed: Luna Salinas, MD [facility] 06/15/2023

## 2023-06-15 NOTE — Progress Notes (Signed)
 PT TX NOTE  06/15/23 1400  PT Visit Information  Last PT Received On 06/15/23  Assistance Needed Pt progressing well, stairs reviewed with pt and wife. Pt is ready to d/c with spouse assisting as needed from PT standpoint.   History of Present Illness Pt s/p fall with non-displaced L THR peri-prothetic fx.  Pt with hx of L THR with revision and gout  Subjective Data  Patient Stated Goal Regain IND  Precautions  Precautions Fall  Recall of Precautions/Restrictions Intact  Restrictions  Weight Bearing Restrictions Per Provider Order Yes  LLE Weight Bearing Per Provider Order TWB  Cognition  Arousal Alert  Behavior During Therapy [facility] for tasks assessed/performed  PT - Cognitive impairments No apparent impairments  Following Commands  Following commands Intact  Cueing  Cueing Techniques Verbal cues  Communication  Communication No apparent difficulties  Bed Mobility  Overal bed mobility Needs Assistance  Bed Mobility Supine to Sit;Sit to Supine  Supine to sit Supervision;HOB elevated  Sit to supine Supervision;HOB elevated  General bed mobility comments cues for sequence and to self assist LLE  Transfers  Overall transfer level Needs assistance  Equipment used Rolling walker (2 wheels)  Transfers Sit to/from Stand  Sit to Stand Supervision  General transfer comment cues for LE management, TDWB and use of UEs to self assist  Ambulation/Gait  Ambulation/Gait assistance Contact guard assist;Supervision  Gait Distance (Feet) 35 Feet  Assistive device Rolling walker (2 wheels)  Gait Pattern/deviations Step-to pattern;Shuffle;Trunk flexed  General Gait Details cues for sequence, posture, and TDWB - pt with good compliance with WB limitations  Gait velocity decr  Stairs Yes  Stairs assistance Min assist;Contact guard assist  Stair Management No rails;Step to pattern;With walker;Backwards  Number of Stairs 3 (x2)  General stair comments assist to maneuver and stabilize RW; cues  for sequence, TDWB and technique. good stability. wife present and able to return demo assist  Balance  Overall balance assessment Needs assistance  Sitting-balance support No upper extremity supported;Feet supported  Sitting balance-[name] Good  Standing balance support No upper extremity supported  Standing balance-[name] Fair  PT - End of Session  Equipment Utilized During Treatment Gait belt  Activity Tolerance Patient tolerated treatment well  Patient left with call bell/phone within reach;with family/visitor present;in bed;with bed alarm set  Nurse Communication Mobility status   PT - Assessment/Plan  PT Visit Diagnosis Difficulty in walking, not elsewhere classified (R26.2)  PT Frequency (ACUTE ONLY) 7X/week  Follow Up Recommendations Home health PT  Patient can return home with the following A little help with walking and/or transfers;A little help with bathing/dressing/bathroom;Assistance with cooking/housework;Assist for transportation;Help with stairs or ramp for entrance  PT equipment None recommended by PT  AM-PAC PT "6 Clicks" Mobility Outcome Measure (Version 2)  Help needed turning from your back to your side while in a flat bed without using bedrails? 3  Help needed moving from lying on your back to sitting on the side of a flat bed without using bedrails? 3  Help needed moving to and from a bed to a chair (including a wheelchair)? 3  Help needed standing up from a chair using your arms (e.g., wheelchair or bedside chair)? 3  Help needed to walk in hospital room? 3  Help needed climbing 3-5 steps with a railing?  2  6 Click Score 17  Consider Recommendation of Discharge To: Home with [facility]  Progressive Mobility  What is the highest level of mobility based on the progressive  mobility assessment? Level 5 (Walks with assist in room/hall) - Balance while stepping forward/back and can walk in room with assist - Complete  PT Goal Progression  Progress towards PT goals  Progressing toward goals  Acute Rehab PT Goals  PT Goal Formulation With patient  Time For Goal Achievement 06/28/23  Potential to Achieve Goals Good  PT Time Calculation  PT Start Time (ACUTE ONLY) 1415  PT Stop Time (ACUTE ONLY) 1430  PT Time Calculation (min) (ACUTE ONLY) 15 min  PT General Charges  $$ ACUTE PT VISIT 1 Visit  PT Treatments  $Gait Training 8-22 mins

## 2023-06-15 NOTE — Progress Notes (Signed)
 OT Cancellation Note  Patient Details Name: Jay Washington MRN: [id number] DOB: [birth year]   Cancelled Treatment:    Reason Eval/Treat Not Completed: OT screened, no needs identified, will sign off Patient reporting that he does not need OT to address ADLs with him. Reported he planned to go home with family support today. OT to sign off Wynette Heckler, MS Acute Rehabilitation Department Office# [phone]  06/15/2023, 10:55 AM
